# Patient Record
Sex: Male | Born: 1960 | Race: White | Hispanic: No | Marital: Married | Smoking: Never smoker
Health system: Southern US, Community
[De-identification: ages and names within clinical notes are randomized; demographics above are authoritative.]

## PROBLEM LIST (undated history)

## (undated) ENCOUNTER — Emergency Department: Payer: 59

## (undated) DIAGNOSIS — R112 Nausea with vomiting, unspecified: Secondary | ICD-10-CM

## (undated) DIAGNOSIS — K219 Gastro-esophageal reflux disease without esophagitis: Secondary | ICD-10-CM

## (undated) DIAGNOSIS — J302 Other seasonal allergic rhinitis: Secondary | ICD-10-CM

## (undated) DIAGNOSIS — M1990 Unspecified osteoarthritis, unspecified site: Secondary | ICD-10-CM

## (undated) DIAGNOSIS — Z9889 Other specified postprocedural states: Secondary | ICD-10-CM

## (undated) DIAGNOSIS — C801 Malignant (primary) neoplasm, unspecified: Secondary | ICD-10-CM

## (undated) DIAGNOSIS — R29810 Facial weakness: Secondary | ICD-10-CM

## (undated) DIAGNOSIS — R202 Paresthesia of skin: Secondary | ICD-10-CM

## (undated) HISTORY — PX: TONSILLECTOMY: SUR1361

## (undated) HISTORY — DX: Unspecified osteoarthritis, unspecified site: M19.90

## (undated) HISTORY — PX: KNEE ARTHROSCOPY: SUR90

## (undated) HISTORY — DX: Paresthesia of skin: R20.2

## (undated) HISTORY — DX: Facial weakness: R29.810

## (undated) HISTORY — DX: Other seasonal allergic rhinitis: J30.2

---

## 1992-12-28 HISTORY — PX: HERNIA REPAIR: SHX51

## 2006-12-28 DIAGNOSIS — C801 Malignant (primary) neoplasm, unspecified: Secondary | ICD-10-CM

## 2006-12-28 HISTORY — PX: THYROIDECTOMY: SHX17

## 2006-12-28 HISTORY — DX: Malignant (primary) neoplasm, unspecified: C80.1

## 2007-04-11 ENCOUNTER — Encounter: Admission: RE | Admit: 2007-04-11 | Discharge: 2007-04-11 | Payer: Self-pay | Admitting: Sports Medicine

## 2007-05-25 ENCOUNTER — Encounter (INDEPENDENT_AMBULATORY_CARE_PROVIDER_SITE_OTHER): Payer: Self-pay | Admitting: Diagnostic Radiology

## 2007-05-25 ENCOUNTER — Ambulatory Visit (HOSPITAL_COMMUNITY): Admission: RE | Admit: 2007-05-25 | Discharge: 2007-05-25 | Payer: Self-pay | Admitting: Otolaryngology

## 2007-07-20 ENCOUNTER — Encounter (INDEPENDENT_AMBULATORY_CARE_PROVIDER_SITE_OTHER): Payer: Self-pay | Admitting: Otolaryngology

## 2007-07-21 ENCOUNTER — Inpatient Hospital Stay (HOSPITAL_COMMUNITY): Admission: RE | Admit: 2007-07-21 | Discharge: 2007-07-25 | Payer: Self-pay | Admitting: Otolaryngology

## 2007-08-12 ENCOUNTER — Encounter: Admission: RE | Admit: 2007-08-12 | Discharge: 2007-08-12 | Payer: Self-pay | Admitting: Endocrinology

## 2007-08-22 ENCOUNTER — Encounter: Admission: RE | Admit: 2007-08-22 | Discharge: 2007-08-22 | Payer: Self-pay | Admitting: Endocrinology

## 2007-08-30 ENCOUNTER — Encounter: Payer: Self-pay | Admitting: Endocrinology

## 2008-03-07 ENCOUNTER — Ambulatory Visit (HOSPITAL_BASED_OUTPATIENT_CLINIC_OR_DEPARTMENT_OTHER): Admission: RE | Admit: 2008-03-07 | Discharge: 2008-03-07 | Payer: Self-pay | Admitting: Otolaryngology

## 2008-03-18 ENCOUNTER — Ambulatory Visit: Payer: Self-pay | Admitting: Internal Medicine

## 2008-07-02 ENCOUNTER — Encounter: Admission: RE | Admit: 2008-07-02 | Discharge: 2008-07-02 | Payer: Self-pay | Admitting: Endocrinology

## 2008-07-03 ENCOUNTER — Encounter: Admission: RE | Admit: 2008-07-03 | Discharge: 2008-07-03 | Payer: Self-pay | Admitting: Endocrinology

## 2008-07-04 ENCOUNTER — Encounter: Admission: RE | Admit: 2008-07-04 | Discharge: 2008-07-04 | Payer: Self-pay | Admitting: Endocrinology

## 2008-07-19 ENCOUNTER — Encounter: Admission: RE | Admit: 2008-07-19 | Discharge: 2008-07-19 | Payer: Self-pay | Admitting: Endocrinology

## 2009-07-08 ENCOUNTER — Encounter (HOSPITAL_COMMUNITY): Admission: RE | Admit: 2009-07-08 | Discharge: 2009-09-26 | Payer: Self-pay | Admitting: Endocrinology

## 2009-11-08 ENCOUNTER — Encounter: Admission: RE | Admit: 2009-11-08 | Discharge: 2009-11-08 | Payer: Self-pay | Admitting: Otolaryngology

## 2011-01-18 ENCOUNTER — Encounter: Payer: Self-pay | Admitting: Sports Medicine

## 2011-01-19 ENCOUNTER — Encounter: Payer: Self-pay | Admitting: Endocrinology

## 2011-05-12 NOTE — Procedures (Signed)
NAME:  Matthew Newton, Matthew Newton NO.:  [id number]   MEDICAL RECORD NO.:  [id number]          PATIENT TYPE:  OUT   LOCATION:  SLEEP CENTER                 FACILITY:  [facility]   PHYSICIAN:  Clinton D. Maple Hudson, MD, FCCP, FACPDATE OF BIRTH:  1959-08-04   DATE OF STUDY:  03/07/2008                            NOCTURNAL POLYSOMNOGRAM   REFERRING PHYSICIAN:  Onalee Hua L. Annalee Genta, M.D.   REFERRING PHYSICIAN:  Victorious Kundinger. Annalee Genta, M.D.   INDICATION FOR STUDY:  Hypersomnia with sleep apnea.   EPWORTH SLEEPINESS SCORE:  8/24.  BMI 29.5.  Weight 200 pounds.  Height  69 inches.  Neck 16.3 inches.   HOME MEDICATIONS:  Charted and reviewed.   SLEEP ARCHITECTURE:  Total sleep time 336 minutes with sleep efficiency  85%.  Stage 1 was 12.5%.  Stage 2 72.9%.  Stage 3 absent.  REM 14.6% of  total sleep time.  Sleep latency 10 minutes, REM latency 203 minutes.  Awake after sleep onset 47 minutes.  Arousal index 9.1.  No bedtime  medication was taken.   RESPIRATORY DATA:  Apnea hypopnea index (AHI) 3.6 events per hour which  is normal.  Twenty events were scored including 18 hypopneas and 2  obstructive apneas.  Most events were associated with supine sleep  position and REM.  REM/AHI 12.2.  There were insufficient events to  qualify for CPAP titration by split night protocol.   OXYGEN DATA:  Mild snoring with oxygen desaturation to a nadir of 84%.  Mean oxygen saturation through the study was 94.4% on room air.  A total  of 0.8 minutes were recorded with oxygen saturation less than 88%.   CARDIAC DATA:  Normal sinus rhythm.   MOVEMENT-PARASOMNIA:  No significant movement disturbance.  Bathroom x1.   IMPRESSIONS-RECOMMENDATIONS:  1. Occasional respiratory events, within normal limits.  AHI 3.6      (normal range 0-3) with most events recorded while supine or in REM      moderately offering potential for improvement if he can sleep on      his sides.  Mild snoring with oxygen desaturation to a  nadir of      84%.  2. 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  He also has a history of thyroid disease.  Both of these      might contribute to complaints of daytime sleepiness if relevant.      Clinton D. Maple Hudson, MD, [facility], FACP  Diplomate, Biomedical engineer of Sleep Medicine  Electronically Signed     CDY/MEDQ  D:  03/18/2008 09:13:39  T:  03/18/2008 09:30:59  Job:  [id number]

## 2011-05-12 NOTE — Op Note (Signed)
NAME:  Matthew Newton, Matthew Newton               ACCOUNT NO.:  [id number]   MEDICAL RECORD NO.:  [id number]          PATIENT TYPE:  OIB   LOCATION:  5740                         FACILITY:  [facility]   PHYSICIAN:  Kinnie Scales. Annalee Genta, M.D.DATE OF BIRTH:  03/03/1961   DATE OF PROCEDURE:  07/20/2007  DATE OF DISCHARGE:                               OPERATIVE REPORT   PREOPERATIVE DIAGNOSIS:  1. Metastatic thyroid cancer to the left neck.  2. Left neck mass.   POSTOPERATIVE DIAGNOSIS:  1. Metastatic thyroid cancer to the left neck.  2. Left neck mass.   SURGICAL PROCEDURES:  1. Total thyroidectomy.  2. Left modified radical neck dissection.   SURGEON:  Kinnie Scales. Annalee Genta, M.D.   ASSISTANTEnrigue Catena H. Pollyann Kennedy, M.D.   ANESTHESIA:  General endotracheal with nerve integrity monitoring  (NIMS).   ESTIMATED BLOOD LOSS:  200 mL.   COMPLICATIONS:  None.   DISPOSITION:  The patient was transferred from the operating room to the  recovery room in stable condition.   BRIEF HISTORY:  Matthew Newton is a 50 year old white male who is referred  to our office for evaluation of a left neck cyst.  The patient had  undergone MRI scan of the neck for chronic upper extremity pain and  numbness and on workup, was noted to have a cystic enhancing mass in the  left superior neck.  Further evaluation included MRI of the neck which  showed complex enhancing mass consistent with possible cyst versus  metastatic disease.  The remainder of the neck including anterior  compartment, right neck, and thyroid appeared normal.  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  Given the  patient's history and physical examination, I recommended the above  surgical procedures.  The risk, benefits, and possible complications of  each of these procedures were discussed in detail with the patient and  his wife and they understood and  concurred with our plan for surgery  which is scheduled for July 20, 2007, under general anesthesia at [facility] OR.   SURGICAL PROCEDURES:  The patient was brought to the operating room at  [facility] on July 20, 2007, was taken to the operating room  and general endotracheal anesthesia established without difficulty.  The  Xomed NIMS endotracheal tube was used throughout the thyroid portion of  the surgical procedure for intraoperative recurrent laryngeal nerve  monitoring. With the patient adequately anesthetized, he was positioned  on the operating table and prepped and draped in a sterile fashion.  He  was injected with 5 mL of 1% lidocaine with 1:100,000 epinephrine,  injected in a subcutaneous fashion along the proposed skin incision.  After allowing adequate time for vasoconstriction and hemostasis, the  procedure was begun.   A curvilinear incision was created along the anterior neck and extended  over the left lateral neck to the tip of the mastoid.  The incision was  carried through the skin and underlying deep subcutaneous tissue.  The  platysma muscle was divided  and subplatysmal flaps were elevated  superiorly and inferiorly.  The surgical procedure was begun with a left  modified radical neck dissection.  The anterior border of the  sternocleidomastoid muscle was palpated and fascia was elevated  anteriorly.  The fascia was then removed from the anterior and medial  components of the sternocleidomastoid muscle which was retracted  laterally.  Superiorly, the posterior belly of the digastric muscle was  identified and the digastric was followed from posterior to anterior.  Dissection was then carried out in the level 1 compartment of the left  neck with dissection of the submandibular gland after identification and  preservation of the left marginal mandibular nerve.  The submandibular  gland was dissected, common facial artery and vein were  divided and  suture ligated, and the submandibular gland was reflected inferiorly.  The lingual nerve contribution to the gland was divided and ligated.  Dissection was carried anteriorly and the submandibular duct was  identified and suture ligated.  The mylohyoid muscle was then elevated  anteriorly.  Dissection was then carried out in the deep aspect of zone  1 of the neck.  The superficial submental area was then dissected.  Dissection was then carried along the right anterior border of the  sternocleidomastoid muscle reflecting the fibrofatty tissue off the  strap muscles and anterior aspect of the neck completing the entire zone  6 anterior compartment neck dissection.   The patient's posterior zone 5 was then palpated.  Dissection was  carried out.  The spinal accessory nerve was identified and preserved  throughout its course. The superior aspect of zone 2 was dissected free  and the entire upper neck was then elevated from posterior to anterior.  Posterior aspect of zone 5 was then dissected inferiorly.  The omohyoid  muscle was divided and dissection carried into the inferior aspect of  zone 4.  The inferior aspect of the jugular vein was identified and  dissection was then carried out along the jugular vein, preserving the  jugular vein and carotid artery as well as vagus nerve throughout their  course.  Anterior contributing branches of the jugular vein were divided  and suture ligated.  Dissection was then carried anteriorly and the  entire neck specimen removed and marked for pathology and sent for  permanent section evaluation.   Attention was then turned to the thyroid. The strap muscles were  identified and divided in the midline.  The right thyroid lobe was  palpated and gently dissected.  The middle thyroid vein was divided and  suture ligated.  The inferior aspect of the gland was then dissected and  the inferior thyroid pedicle was identified, divided, and suture   ligated.  A parathyroid gland was identified at this location. The  recurrent laryngeal nerve was identified and was traced from proximal to  distal. The superior aspect of the right thyroid lobe was then dissected  and the vascular pedicle was divided and suture ligated.  The right  thyroid lobe was reflected anteriorly and dissected from the trachea  where there was no apparent adhesion or significant attachment.  The  left thyroid lobe was then treated in a similar fashion with gentle  dissection external to the gland dividing and suture ligating the  vascular structures and identifying the inferior parathyroid gland which  was preserved.  The recurrent laryngeal nerve on the left hand side was  identified and traced distally. The gland was then reflected off its  tracheal attachments and divided  at the ligament.  This was sent to  pathology for gross microscopic evaluation.   The anterior compartment of the neck was then inspected and palpated.  The suprasternal area was free of any lymphadenopathy or disease and the  remainder of the neck appeared normal.  The patient's wound was closed  in layers. A 7 mm flat Blake drain was placed on the right aspect of the  neck at the thyroid bed, on the left hand side, a 10 mm Blake drain was  placed on the neck dissection site, and these were sutured into position  through an external stab incision.  The wound was then closed entirely  after reapproximation of the anterior strap muscles with 2-0 chromic  suture in an interrupted fashion.  The subplatysmal layer was closed  with 3-0 Vicryl suture and deep subcutaneous layer was closed with the  same stitch.  The final skin margins were closed with surgical staples  and bacitracin was applied. Prior to closure, the recurrent laryngeal  nerves were identified and using the NIMS monitor were stimulated, they  appeared to respond to normal stimulation at 0.50 mA.   The patient's wound was then  dressed bacitracin ointment.  He was  awakened from his anesthetic and extubated, he was transferred from the  operating room to the recovery room in stable condition.  No  complications.  Blood loss approximately 200 mL.           ______________________________  Kinnie Scales. Annalee Genta, M.D.     DLS/MEDQ  D:  16/09/9603  T:  07/20/2007  Job:  [id number]

## 2011-05-15 NOTE — Discharge Summary (Signed)
NAME:  Matthew Newton, MATHES NO.:  [id number]   MEDICAL RECORD NO.:  [id number]          PATIENT TYPE:  INP   LOCATION:  5740                         FACILITY:  [facility]   PHYSICIAN:  Kinnie Scales. Annalee Genta, M.D.DATE OF BIRTH:  11-Apr-1961   DATE OF ADMISSION:  07/20/2007  DATE OF DISCHARGE:  07/25/2007                               DISCHARGE SUMMARY   PREOPERATIVE/POSTOPERATIVE DIAGNOSIS AND INDICATION FOR SURGERY:  1. 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  2. Postoperative hypocalcemia.   PROCEDURE THIS ADMISSION:  Total thyroidectomy and left modified radical  neck dissection performed on July 20, 2007.  The patient is discharged  to home in stable condition in the company of his family.   DISCHARGE MEDICATIONS:  Include:  1. Percocet 5/325 one-two p.o. every 4-6 hours, dispense 30 without      refills.  2. Augmentin 500 mg p.o. b.i.d. for 10 days.  3. Calcium supplementation including calcium carbonate 1000 mg t.i.d.  4. Calcitriol 1 tablet t.i.d.   The patient is to limit physical activity.  No lifting, straining, or  exercise.  Wound care consists of half-strength hydroperoxide and  antibiotic ointment on a twice-daily basis.  The patient will resume low-  iodine diet as tolerated and will follow up with Dr. Annalee Genta 1 week  postoperatively for evaluation and staple removal and follow up with Dr.  Dorisann Frames for management of postoperative hypocalcemia and long-  term management of hypothyroidism and thyroid cancer.   BRIEF HISTORY:  Matthew Newton is a 50 year old white male who was referred  for evaluation of a cystic left superior neck mass.  The patient had  undergone an MRI scan for neck pain.  He was found to have mild  degenerative joint changes and some chronic disk disease.  An incidental  finding noted a cystic mass in the left superolateral neck.  Evaluation  of this area, including physical examination and  fine-needle aspiration  with ultrasound guidance showed findings consistent with metastatic  papillary carcinoma of the thyroid.  Re-evaluation of the thyroid on  imaging showed no evidence of abnormality or nodular mass.  There were  no other areas of concern with the exception of cystic adenopathy in the  left neck.  Given the patient's history and physical examination,  recommended undertaking left neck dissection for removal of cystic mass  and then proceeding with a total thyroidectomy based on intraoperative  pathologic findings.  Risks, benefits, possible complications of these  procedures were discussed in detail with the patient and his wife, and  they understood and concurred with our plan for surgery, which is  scheduled for general anesthesia at [facility] on July 20, 2007, with intraoperative nerve monitoring.   HOSPITAL COURSE:  The patient is admitted to the ENT service under Dr.  Thurmon Fair care on July 20, 2007.  He underwent a left modified radical  neck dissection and total thyroidectomy, under general anesthesia.  He  was transferred from the operating room to recovery and from recovery to  unit 5700 for postoperative  care.  The patient had anticipated  postoperative course regarding his neck surgery with gradual decrease in  drainage output from the surgically placed drain.  On the third  postoperative day, drains were removed, as the output had fallen to an  appropriate level, and the patient continued intraoperative monitoring  for hypocalcemia and fell from a normal calcium level to a calcium nadir  of 7.5 on July 24, 2007.  The endocrine service under Dr. Willeen Cass care  was consulted for evaluation and management of postoperative  hypocalcemia.  The patient had been started on oral calcium  supplementation at the time of his surgical procedure, and calcitriol  was added, 1.0 mcg b.i.d.  It was increased to t.i.d. in order to  control hypocalcemia, and  his levels were closely monitored.  By the  time of discharge, on July 25, 2007, the patient's calcium level had  risen to 8.5, and he was clinically stable.  The patient had normal oral  intake with soft diet and a normal bowel and bladder function with mild  constipation, which was managed with Dulcolax suppository.  He had  normal ambulation, and incision was intact and healing well at the time  of his discharge on July 25, 2007.  The patient was discharged home in  stable condition in the company of his family, who understand and concur  with our discharge plan, which is outlined as above.  He will follow up  within 1 week for postoperative care or sooner as warranted by any  further problems regarding hypocalcemia.           ______________________________  Kinnie Scales. Annalee Genta, M.D.     DLS/MEDQ  D:  16/09/9603  T:  08/24/2007  Job:  [id number]   cc:   Dorisann Frames, M.D.

## 2011-10-12 LAB — CBC
HCT: 42.8
MCV: 87
Platelets: 294
RBC: 4.92
WBC: 6.6

## 2011-10-12 LAB — ALBUMIN: Albumin: 3.4 — ABNORMAL LOW

## 2011-10-12 LAB — BASIC METABOLIC PANEL
Chloride: 105
Creatinine, Ser: 1.14
GFR calc Af Amer: >60
GFR calc non Af Amer: >60
Potassium: 4.7

## 2011-10-12 LAB — CALCIUM
Calcium: 7.5 — ABNORMAL LOW
Calcium: 7.7 — ABNORMAL LOW
Calcium: 7.9 — ABNORMAL LOW
Calcium: 8.1 — ABNORMAL LOW
Calcium: 8.3 — ABNORMAL LOW
Calcium: 8.5
Calcium: 8.6

## 2011-12-15 ENCOUNTER — Other Ambulatory Visit: Payer: Self-pay | Admitting: Orthopedic Surgery

## 2012-01-04 ENCOUNTER — Encounter (HOSPITAL_BASED_OUTPATIENT_CLINIC_OR_DEPARTMENT_OTHER): Payer: Self-pay | Admitting: *Deleted

## 2012-01-07 ENCOUNTER — Ambulatory Visit (HOSPITAL_BASED_OUTPATIENT_CLINIC_OR_DEPARTMENT_OTHER)
Admission: RE | Admit: 2012-01-07 | Discharge: 2012-01-07 | Disposition: A | Discharge status: Home / Self Care | Payer: 59 | Source: Ambulatory Visit | Attending: Orthopedic Surgery | Admitting: Orthopedic Surgery

## 2012-01-07 ENCOUNTER — Encounter (HOSPITAL_BASED_OUTPATIENT_CLINIC_OR_DEPARTMENT_OTHER): Payer: Self-pay | Admitting: Anesthesiology

## 2012-01-07 ENCOUNTER — Encounter (HOSPITAL_BASED_OUTPATIENT_CLINIC_OR_DEPARTMENT_OTHER)
Admission: RE | Disposition: A | Discharge status: Home / Self Care | Payer: Self-pay | Source: Ambulatory Visit | Attending: Orthopedic Surgery

## 2012-01-07 ENCOUNTER — Encounter (HOSPITAL_BASED_OUTPATIENT_CLINIC_OR_DEPARTMENT_OTHER): Payer: Self-pay | Admitting: *Deleted

## 2012-01-07 ENCOUNTER — Ambulatory Visit (HOSPITAL_BASED_OUTPATIENT_CLINIC_OR_DEPARTMENT_OTHER): Payer: 59 | Admitting: Anesthesiology

## 2012-01-07 DIAGNOSIS — IMO0002 Reserved for concepts with insufficient information to code with codable children: Secondary | ICD-10-CM | POA: Insufficient documentation

## 2012-01-07 DIAGNOSIS — K219 Gastro-esophageal reflux disease without esophagitis: Secondary | ICD-10-CM | POA: Insufficient documentation

## 2012-01-07 DIAGNOSIS — E039 Hypothyroidism, unspecified: Secondary | ICD-10-CM | POA: Insufficient documentation

## 2012-01-07 DIAGNOSIS — M24139 Other articular cartilage disorders, unspecified wrist: Secondary | ICD-10-CM | POA: Insufficient documentation

## 2012-01-07 HISTORY — PX: WRIST ARTHROSCOPY: SHX838

## 2012-01-07 HISTORY — DX: Nausea with vomiting, unspecified: R11.2

## 2012-01-07 HISTORY — DX: Malignant (primary) neoplasm, unspecified: C80.1

## 2012-01-07 HISTORY — DX: Gastro-esophageal reflux disease without esophagitis: K21.9

## 2012-01-07 HISTORY — DX: Other specified postprocedural states: Z98.890

## 2012-01-07 LAB — POCT HEMOGLOBIN-HEMACUE: Hemoglobin: 14.1 g/dL (ref 13.0–17.0)

## 2012-01-07 SURGERY — ARTHROSCOPY, WRIST
Anesthesia: General | Site: Wrist | Laterality: Left | Wound class: Clean

## 2012-01-07 MED ORDER — LACTATED RINGERS IV SOLN
INTRAVENOUS | Status: DC
  Administered 2012-01-07 (×3): via INTRAVENOUS

## 2012-01-07 MED ORDER — FENTANYL CITRATE 0.05 MG/ML IJ SOLN
50.0000 ug | INTRAMUSCULAR | Status: DC | PRN
  Administered 2012-01-07: 100 ug via INTRAVENOUS

## 2012-01-07 MED ORDER — HYDROMORPHONE HCL 2 MG PO TABS
ORAL_TABLET | ORAL | Status: AC
Start: 2012-01-07 — End: 2012-01-17

## 2012-01-07 MED ORDER — PROPOFOL 10 MG/ML IV EMUL
INTRAVENOUS | Status: DC | PRN
  Administered 2012-01-07: 200 mg via INTRAVENOUS

## 2012-01-07 MED ORDER — FENTANYL CITRATE 0.05 MG/ML IJ SOLN: 25.0000 ug | INTRAMUSCULAR | Status: DC | PRN

## 2012-01-07 MED ORDER — CHLORHEXIDINE GLUCONATE 4 % EX LIQD: 60.0000 mL | Freq: Once | CUTANEOUS | Status: DC

## 2012-01-07 MED ORDER — DEXAMETHASONE SODIUM PHOSPHATE 4 MG/ML IJ SOLN
INTRAMUSCULAR | Status: DC | PRN
  Administered 2012-01-07: 10 mg via INTRAVENOUS

## 2012-01-07 MED ORDER — CEPHALEXIN 500 MG PO CAPS: 500.0000 mg | ORAL_CAPSULE | Freq: Three times a day (TID) | ORAL | Status: AC

## 2012-01-07 MED ORDER — CEFAZOLIN SODIUM 1-5 GM-% IV SOLN
1.0000 g | Freq: Once | INTRAVENOUS | Status: AC
Start: 2012-01-07 — End: 2012-01-07
  Administered 2012-01-07: 2 g via INTRAVENOUS

## 2012-01-07 MED ORDER — MORPHINE SULFATE 2 MG/ML IJ SOLN: 0.0500 mg/kg | INTRAMUSCULAR | Status: DC | PRN

## 2012-01-07 MED ORDER — IBUPROFEN 600 MG PO TABS: 600.0000 mg | ORAL_TABLET | Freq: Four times a day (QID) | ORAL | Status: AC | PRN

## 2012-01-07 MED ORDER — ONDANSETRON HCL 4 MG/2ML IJ SOLN
INTRAMUSCULAR | Status: DC | PRN
  Administered 2012-01-07: 4 mg via INTRAVENOUS

## 2012-01-07 MED ORDER — LIDOCAINE HCL 1 % IJ SOLN
INTRAMUSCULAR | Status: DC | PRN
  Administered 2012-01-07: 2 mL via INTRADERMAL

## 2012-01-07 MED ORDER — METOCLOPRAMIDE HCL 5 MG/ML IJ SOLN: 10.0000 mg | Freq: Once | INTRAMUSCULAR | Status: DC | PRN

## 2012-01-07 MED ORDER — ROPIVACAINE HCL 5 MG/ML IJ SOLN
INTRAMUSCULAR | Status: DC | PRN
  Administered 2012-01-07: 30 mL via EPIDURAL

## 2012-01-07 MED ORDER — MIDAZOLAM HCL 2 MG/2ML IJ SOLN
0.5000 mg | INTRAMUSCULAR | Status: DC | PRN
  Administered 2012-01-07: 2 mg via INTRAVENOUS

## 2012-01-07 SURGICAL SUPPLY — 101 items
BANDAGE ADHESIVE 1X3 (GAUZE/BANDAGES/DRESSINGS) IMPLANT
BANDAGE ELASTIC 3 VELCRO ST LF (GAUZE/BANDAGES/DRESSINGS) ×3 IMPLANT
BANDAGE ELASTIC 4 VELCRO ST LF (GAUZE/BANDAGES/DRESSINGS) ×2 IMPLANT
BANDAGE GAUZE ELAST BULKY 4 IN (GAUZE/BANDAGES/DRESSINGS) ×2 IMPLANT
BIT DRILL 1.8 (BIT) ×1
BIT DRILL 1.8MM (BIT) IMPLANT
BIT DRILL 2.4 (BIT) ×1
BIT DRILL QC 2.4 MINI 80 (BIT) IMPLANT
BLADE AVERAGE 25X9 (BLADE) ×1 IMPLANT
BLADE MINI RND TIP GREEN BEAV (BLADE) ×1 IMPLANT
BLADE SURG 15 STRL LF DISP TIS (BLADE) ×1 IMPLANT
BLADE SURG 15 STRL SS (BLADE) ×4
BNDG CMPR 9X4 STRL LF SNTH (GAUZE/BANDAGES/DRESSINGS) ×1
BNDG ESMARK 4X9 LF (GAUZE/BANDAGES/DRESSINGS) ×2 IMPLANT
BRUSH SCRUB EZ PLAIN DRY (MISCELLANEOUS) ×2 IMPLANT
BUR CUDA 2.9 (BURR) IMPLANT
BUR FULL RADIUS 2.9 (BURR) IMPLANT
BUR GATOR 2.9 (BURR) ×1 IMPLANT
BUR SPHERICAL 2.9 (BURR) IMPLANT
CANISTER SUCTION 1200CC (MISCELLANEOUS) IMPLANT
CLOTH BEACON ORANGE TIMEOUT ST (SAFETY) ×2 IMPLANT
CORDS BIPOLAR (ELECTRODE) ×1 IMPLANT
COVER MAYO STAND STRL (DRAPES) ×2 IMPLANT
COVER TABLE BACK 60X90 (DRAPES) ×2 IMPLANT
CUFF TOURNIQUET SINGLE 18IN (TOURNIQUET CUFF) ×1 IMPLANT
DECANTER SPIKE VIAL GLASS SM (MISCELLANEOUS) IMPLANT
DRAPE EXTREMITY T 121X128X90 (DRAPE) ×2 IMPLANT
DRAPE OEC MINIVIEW 54X84 (DRAPES) ×1 IMPLANT
DRAPE SURG 17X23 STRL (DRAPES) ×2 IMPLANT
DRILL BIT 1.8MM (BIT) ×2
DRILL BIT 2.4MM (BIT) ×2
DRSG EMULSION OIL 3X3 NADH (GAUZE/BANDAGES/DRESSINGS) IMPLANT
GAUZE KERLIX 2  STERILE LF (GAUZE/BANDAGES/DRESSINGS) ×1 IMPLANT
GAUZE XEROFORM 1X8 LF (GAUZE/BANDAGES/DRESSINGS) IMPLANT
GLOVE BIO SURGEON STRL SZ 6.5 (GLOVE) ×2 IMPLANT
GLOVE BIOGEL M STRL SZ7.5 (GLOVE) ×2 IMPLANT
GLOVE BIOGEL PI IND STRL 7.0 (GLOVE) IMPLANT
GLOVE BIOGEL PI INDICATOR 7.0 (GLOVE) ×1
GLOVE ORTHO TXT STRL SZ7.5 (GLOVE) ×2 IMPLANT
GOWN PREVENTION PLUS XLARGE (GOWN DISPOSABLE) ×2 IMPLANT
GOWN PREVENTION PLUS XXLARGE (GOWN DISPOSABLE) ×4 IMPLANT
IV NS 500ML (IV SOLUTION) ×2
IV NS 500ML BAXH (IV SOLUTION) ×1 IMPLANT
IV NS IRRIG 3000ML ARTHROMATIC (IV SOLUTION) ×1 IMPLANT
LOOP VESSEL MAXI BLUE (MISCELLANEOUS) IMPLANT
NDL EPIDURAL TUOHY 20GX3.5 (NEEDLE) IMPLANT
NDL MAYO 6 CRC TAPER PT (NEEDLE) IMPLANT
NDL SAFETY ECLIPSE 18X1.5 (NEEDLE) IMPLANT
NEEDLE 27GAX1X1/2 (NEEDLE) IMPLANT
NEEDLE HYPO 18GX1.5 SHARP (NEEDLE) ×2
NEEDLE HYPO 22GX1.5 SAFETY (NEEDLE) ×2 IMPLANT
NEEDLE MAYO 6 CRC TAPER PT (NEEDLE) IMPLANT
NEEDLE TUOHY 20GX3.5 (NEEDLE) IMPLANT
NS IRRIG 1000ML POUR BTL (IV SOLUTION) ×1 IMPLANT
PACK BASIN DAY SURGERY FS (CUSTOM PROCEDURE TRAY) ×2 IMPLANT
PAD CAST 3X4 CTTN HI CHSV (CAST SUPPLIES) ×1 IMPLANT
PADDING CAST ABS 4INX4YD NS (CAST SUPPLIES) ×1
PADDING CAST ABS COTTON 4X4 ST (CAST SUPPLIES) ×1 IMPLANT
PADDING CAST COTTON 3X4 STRL (CAST SUPPLIES) ×4
PASSER SUT SWANSON 36MM LOOP (INSTRUMENTS) IMPLANT
PLATE LCP 6H 52MM 2.4MM (Plate) ×1 IMPLANT
SCREW CORTEX 2.4X14 (Screw) ×2 IMPLANT
SCREW CORTEX 2.4X16MM (Screw) ×2 IMPLANT
SCREW CORTEX 2.4X18 (Screw) ×1 IMPLANT
SCREW LOCKING 2.4X14MM (Screw) ×1 IMPLANT
SET SM JOINT TUBING/CANN (CANNULA) ×2 IMPLANT
SLEEVE SCD COMPRESS KNEE MED (MISCELLANEOUS) ×1 IMPLANT
SPLINT PLASTER CAST XFAST 3X15 (CAST SUPPLIES) ×1 IMPLANT
SPLINT PLASTER EXTRA FAST 3X15 (CAST SUPPLIES) ×16
SPLINT PLASTER GYPS XFAST 3X15 (CAST SUPPLIES) IMPLANT
SPLINT PLASTER XTRA FASTSET 3X (CAST SUPPLIES)
SPONGE GAUZE 4X4 12PLY (GAUZE/BANDAGES/DRESSINGS) ×2 IMPLANT
STOCKINETTE 4X48 STRL (DRAPES) ×2 IMPLANT
STRIP CLOSURE SKIN 1/2X4 (GAUZE/BANDAGES/DRESSINGS) ×1 IMPLANT
SUCTION FRAZIER TIP 10 FR DISP (SUCTIONS) IMPLANT
SUT ETHIBOND 3-0 V-5 (SUTURE) ×1 IMPLANT
SUT ETHILON 5 0 P 3 18 (SUTURE)
SUT FIBERWIRE 2-0 18 17.9 3/8 (SUTURE) ×2
SUT FIBERWIRE 3-0 18 TAPR NDL (SUTURE) ×2
SUT MERSILENE 4 0 P 3 (SUTURE) IMPLANT
SUT NYLON ETHILON 5-0 P-3 1X18 (SUTURE) IMPLANT
SUT PROLENE 3 0 PS 2 (SUTURE) ×1 IMPLANT
SUT STEEL 0 (SUTURE) ×2
SUT STEEL 0 18XMFL TIE 17 (SUTURE) IMPLANT
SUT STEEL 3 0 (SUTURE) IMPLANT
SUT VIC AB 0 CT1 27 (SUTURE)
SUT VIC AB 0 CT1 27XBRD ANBCTR (SUTURE) IMPLANT
SUT VIC AB 2-0 SH 27 (SUTURE)
SUT VIC AB 2-0 SH 27XBRD (SUTURE) IMPLANT
SUT VIC AB 4-0 P-3 18XBRD (SUTURE) IMPLANT
SUT VIC AB 4-0 P3 18 (SUTURE) ×4
SUT VICRYL 4-0 PS2 18IN ABS (SUTURE) IMPLANT
SUTURE FIBERWR 2-0 18 17.9 3/8 (SUTURE) IMPLANT
SUTURE FIBERWR 3-0 18 TAPR NDL (SUTURE) IMPLANT
SYR 3ML 23GX1 SAFETY (SYRINGE) IMPLANT
SYR BULB 3OZ (MISCELLANEOUS) ×1 IMPLANT
SYR CONTROL 10ML LL (SYRINGE) ×2 IMPLANT
TRAY DSU PREP LF (CUSTOM PROCEDURE TRAY) ×2 IMPLANT
TUBE CONNECTING 20X1/4 (TUBING) ×2 IMPLANT
UNDERPAD 30X30 INCONTINENT (UNDERPADS AND DIAPERS) ×2 IMPLANT
WATER STERILE IRR 1000ML POUR (IV SOLUTION) ×2 IMPLANT

## 2012-01-07 NOTE — Op Note (Signed)
Op note dictated: [id number] 01/07/12

## 2012-01-07 NOTE — Anesthesia Preprocedure Evaluation (Signed)
Anesthesia Evaluation  Patient identified by MRN, date of birth, ID band Patient awake    Reviewed: Allergy & Precautions, H&P , NPO status , Patient's Chart, lab work & pertinent test results, reviewed documented beta blocker date and time   History of Anesthesia Complications (+) PONV  Airway Mallampati: II TM Distance: >3 FB Neck ROM: full    Dental   Pulmonary neg pulmonary ROS,          Cardiovascular neg cardio ROS     Neuro/Psych Negative Neurological ROS  Negative Psych ROS   GI/Hepatic negative GI ROS, Neg liver ROS, GERD-  Medicated and Controlled,  Endo/Other  Negative Endocrine ROSHypothyroidism   Renal/GU negative Renal ROS  Genitourinary negative   Musculoskeletal   Abdominal   Peds  Hematology negative hematology ROS (+)   Anesthesia Other Findings See surgeon's H&P   Reproductive/Obstetrics negative OB ROS                           Anesthesia Physical Anesthesia Plan  ASA: II  Anesthesia Plan: General   Post-op Pain Management: MAC Combined w/ Regional for Post-op pain   Induction:   Airway Management Planned: LMA  Additional Equipment:   Intra-op Plan:   Post-operative Plan: Extubation in OR  Informed Consent: I have reviewed the patients History and Physical, chart, labs and discussed the procedure including the risks, benefits and alternatives for the proposed anesthesia with the patient or authorized representative who has indicated his/her understanding and acceptance.     Plan Discussed with: CRNA and Surgeon  Anesthesia Plan Comments:         Anesthesia Quick Evaluation

## 2012-01-07 NOTE — H&P (Signed)
Matthew Newton is an 51 y.o. male.   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 This occurred third 2012. At that time he tried to set her up for a large fish. This radially deviated his left wrist. He weight for approximately 3 months to see if his symptoms with a plate. He still had pain with radial deviation and certain load bearing strains on the left wrist. He presented to our office in July of 2012 for further evaluation and treatment. He underwent an MRI of the left wrist in December of 2012. This revealed a marked ulnar plus variance. TheTFC was found to be intact. He had a partial tear of the scapholunate ligament in addition to moderate carpal joint effusion and pisotriquetral arthrosis. There was also a non-united ulnar styloid fracture. His findings were discussed with the patient at length and he was advised to undergo diagnostic arthroscopy with excision of trapeziform and possible ulnar shortening.  Past Medical History  Diagnosis Date  . Cancer 2008    thyroid  . GERD (gastroesophageal reflux disease)   . PONV (postoperative nausea and vomiting)     Past Surgical History  Procedure Date  . Tonsillectomy   . Thyroidectomy 2008  . Knee arthroscopy 1990 & 1991  . Hernia repair 1994    Right    History reviewed. No pertinent family history. Social History:  reports that he has never smoked. He has never used smokeless tobacco. He reports that he does not drink alcohol or use illicit drugs.  Allergies:  Allergies  Allergen Reactions  . Contrast Media (Iodinated Diagnostic Agents) Shortness Of Breath    Pt states it "closed his chest up" and he "couldn't breathe".    Medications Prior to Admission  Medication Dose Route Frequency Provider Last Rate Last Dose  . ceFAZolin (ANCEF) IVPB 1 g/50 mL premix  1  g Intravenous Once Wyn Forster., MD      . chlorhexidine (HIBICLENS) 4 % liquid 4 application  60 mL Topical Once       . lactated ringers infusion   Intravenous Continuous Constance Goltz, MD 10 mL/hr at 01/07/12 1029     Medications Prior to Admission  Medication Sig Dispense Refill  . cetirizine (ZYRTEC) 10 MG tablet Take 10 mg by mouth daily.        Marland Kitchen dexlansoprazole (DEXILANT) 60 MG capsule Take 60 mg by mouth daily.        Marland Kitchen levothyroxine (SYNTHROID, LEVOTHROID) 112 MCG tablet Take 112 mcg by mouth daily.        . multivitamin-iron-minerals-folic acid (CENTRUM) chewable tablet Chew 1 tablet by mouth daily.          No results found for this or any previous visit (from the past 48 hour(s)).  No results found.   Pertinent items are noted in HPI.  Blood pressure 123/83, pulse 69, temperature 97.5 F (36.4 C), temperature source Oral, resp. rate 16, height 5\' 9"  (1.753 m), weight 83.915 kg (185 lb), SpO2 100.00%.  General appearance: alert Head: Normocephalic, without obvious abnormality Neck: supple, symmetrical, trachea midline Resp: clear to auscultation bilaterally Cardio: regular rate and rhythm, S1, S2 normal, no murmur, click, rub or gallop GI: normal findings: bowel sounds normal Extremities: Examination of his left wrist revealed full range of motion of the wrist and fingers. He is tender on palpation  of the nonunion site of the ulnar styloid base. He does not have traditional ulnar carpal abutment signs. His pisotriquetral grind test is positive Watson's maneuver is nontender. He has no pain with resisted pronation or supination. 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 Plan patient to be taken to the operating room to undergo left wrist arthroscopy  with debridement, excision of his pisiform and possible ulnar shortening as well as ulnar styloid non union repair.The procedure risks benefits and postoperative course were discussed with the patient at length and he was in agreement with this plan.  DASNOIT,Zailen Albarran J 01/07/2012, 11:17 AM   H&P documentation: 01/07/2012  -History and Physical Reviewed  -Patient has been re-examined  -No change in the plan of care  Wyn Forster, MD

## 2012-01-07 NOTE — Anesthesia Postprocedure Evaluation (Signed)
Anesthesia Post Note  Patient: Matthew Newton  Procedure(s) Performed:  ARTHROSCOPY WRIST - left wrist arthroscopy with debridement triangular fibrocartlidge complex repair ulna shortening and ulna nonunion repair.  Anesthesia type: General  Patient location: PACU  Post pain: Pain level controlled  Post assessment: Patient's Cardiovascular Status Stable  Last Vitals:  Filed Vitals:   01/07/12 1628  BP: 119/75  Pulse: 69  Temp: 36.5 C  Resp: 18    Post vital signs: Reviewed and stable  Level of consciousness: alert  Complications: No apparent anesthesia complications

## 2012-01-07 NOTE — Transfer of Care (Signed)
Immediate Anesthesia Transfer of Care Note  Patient: Matthew Newton  Procedure(s) Performed:  ARTHROSCOPY WRIST - left wrist arthroscopy with debridement triangular fibrocartlidge complex repair ulna shortening and ulna nonunion repair.  Patient Location: PACU  Anesthesia Type: GA combined with regional for post-op pain  Level of Consciousness: awake  Airway & Oxygen Therapy: Patient Spontanous Breathing and Patient connected to face mask oxygen  Post-op Assessment: Report given to PACU RN and Post -op Vital signs reviewed and stable  Post vital signs: Reviewed and stable Filed Vitals:   01/07/12 1007  BP: 123/83  Pulse: 69  Temp: 36.4 C  Resp: 16    Complications: No apparent anesthesia complications

## 2012-01-07 NOTE — Brief Op Note (Signed)
01/07/2012  3:17 PM  PATIENT:  Matthew Newton  51 y.o. 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  PROCEDURE:  Procedure(s): ARTHROSCOPY WRIST DOCUMENTATION  OF UNSTABLE SUPERFICIAL TFCC, COMPLETE LUNATO TRIQUETRAL LIGAMENT TEAR,  ULNAR SHORTENING, REPAIR OF ULNAR STYLOID NON UNION  SURGEON:  Surgeon(s): Wyn Forster., MD  PHYSICIAN ASSISTANT:   ASSISTANTS: Mallory Shirk.A-C    ANESTHESIA:   general  EBL:  Total I/O In: 1000 [I.V.:1000] Out: -   BLOOD ADMINISTERED:none  DRAINS: none   LOCAL MEDICATIONS USED:  Plexus block  SPECIMEN:  No Specimen  DISPOSITION OF SPECIMEN:  N/A  COUNTS:  YES  TOURNIQUET:   Total Tourniquet Time Documented: Upper Arm (Left) - 108 minutes  DICTATION: .Other Dictation: Dictation Number [id number]  PLAN OF CARE: Discharge to home after PACU  PATIENT DISPOSITION:  PACU - hemodynamically stable.   Delay start of Pharmacological VTE agent (>24hrs) due to surgical blood loss or risk of bleeding:  NOT APPLICABLE:

## 2012-01-07 NOTE — Anesthesia Procedure Notes (Addendum)
Anesthesia Regional Block:  Supraclavicular block  Pre-Anesthetic Checklist: ,, timeout performed, Correct Patient, Correct Site, Correct Laterality, Correct Procedure, Correct Position, site marked, Risks and benefits discussed,  Surgical consent,  Pre-op evaluation,  At surgeon's request and post-op pain management  Laterality: Left  Prep: chloraprep       Needles:   Needle Type: Other   (Arrow Echogenic)   Needle Length: 9cm  Needle Gauge: 21    Additional Needles:  Procedures: ultrasound guided Supraclavicular block Narrative:  Start time: 01/07/2012 11:53 AM End time: 01/07/2012 12:00 PM Injection made incrementally with aspirations every 5 mL.  Performed by: Personally  Anesthesiologist: C Frederick  Additional Notes: Ultrasound guidance used to: id relevant anatomy, confirm needle position, local anesthetic spread, avoidance of vascular puncture. Picture saved. No complications. Block performed personally by Janetta Hora. Gelene Mink, MD    Supraclavicular block Procedure Name: LMA Insertion Date/Time: 01/07/2012 12:51 PM Performed by: Jearld Shines Pre-anesthesia Checklist: Patient identified, Emergency Drugs available, Suction available, Patient being monitored and Timeout performed Patient Re-evaluated:Patient Re-evaluated prior to inductionOxygen Delivery Method: Circle System Utilized Preoxygenation: Pre-oxygenation with 100% oxygen Intubation Type: IV induction Ventilation: Mask ventilation without difficulty LMA: LMA inserted LMA Size: 4.0 Number of attempts: 1 Airway Equipment and Method: bite block Placement Confirmation: positive ETCO2 Tube secured with: Tape Dental Injury: Teeth and Oropharynx as per pre-operative assessment

## 2012-01-07 NOTE — Progress Notes (Signed)
Assisted Dr. Frederick with left, ultrasound guided, supraclavicular block. Side rails up, monitors on throughout procedure. See vital signs in flow sheet. Tolerated Procedure well. 

## 2012-01-08 ENCOUNTER — Encounter (HOSPITAL_BASED_OUTPATIENT_CLINIC_OR_DEPARTMENT_OTHER): Payer: Self-pay | Admitting: Orthopedic Surgery

## 2012-01-08 NOTE — Op Note (Signed)
NAME:  Matthew Newton, Matthew Newton NO.:  [id number]  MEDICAL RECORD NO.:  [id number]  LOCATION:                                 FACILITY:  PHYSICIAN:  Katy Fitch. Duaa Stelzner, M.D.      DATE OF BIRTH:  DATE OF PROCEDURE:  01/07/2012 DATE OF DISCHARGE:                              OPERATIVE REPORT   PREOPERATIVE DIAGNOSES:  Chronic ulnar-sided wrist pain, right wrist with load-bearing discomfort consistent with ulnocarpal abutment, probable lunotriquetral ligament tear, and possible scapholunate ligament tear.  Preoperative MRI documented evidence of chronic ulnar plus variance due to prior trauma as a young man with a nonunion of the ulnar styloid, a superficial tear of the triangular fibrocartilage with the foveal attachment intact and partial scapholunate interosseous ligament tear.  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  OPERATIONS: 1. Diagnostic arthroscopy, left wrist followed by limited synovectomy     and arthroscopic examination of triangular fibrocartilage,     scapholunate ligament, and lunotriquetral interosseous ligament. 2. A 4 mm ulnar shortening with application of a 2.4 mm ASIF hybrid     plate with lag screw fixation across the osteoplasty. 3. Reconstruction of ulnar styloid nonunion with resection of fibrous     nonunion and tension band wire fixation. 4. Reconstruction of triangular fibrocartilage with 3-0 FiberWire     grasping suture secured to ulnar styloid.  The pisotriquetral joint     was ultimately left intact.  OPERATING SURGEON:  Katy Fitch. Anant Agard, MD  ASSISTANT:  Marveen Reeks Dasnoit, PA-C  ANESTHESIA:  General by LMA supplemented by a left plexus  block.  SUPERVISING ANESTHESIOLOGIST:  Janetta Hora. Gelene Mink, M.D.  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  As a young man, he sustained a significant injury to his wrist in which he probably injured the scapholunate ligament, sustained a fracture of the ulnar styloid and had some type of growth interruption leading to a marked overgrowth of the ulna.  He subsequently presented at age 31 for evaluation at our office and was noted to have wrist pain.  We initially diagnosed a ulnar styloid nonunion and had suspicions that he had a triangular fibrocartilage tear.  We subsequently proceeded with plain films.  This documented his ulnar styloid nonunion and a marked ulnar positive variance.  We ultimately sent him for an MRI of his wrist which documented the aforementioned superficial tear of the triangular fibrocartilage, a stretched scapholunate ligament.  The MRI was not clearly diagnostic for an LT ligament tear and there was some abnormal hyaline articular cartilage signal on the triquetrum and lunate noted consistent with chronic ulnocarpal abutment.  We advised Matthew Newton to strongly consider proceeding with diagnostic arthroscopy, so that we could refine our understanding of his abutment predicament.  If he showed signs of hyaline cartilage loss or an LT ligament tear, our plan was to  shorten the ulna.  We planned on repairing his triangular fibrocartilage and repairing the ulnar nonunion.  The MRI did document significant pisotriquetral arthrosis.  We contemplated possibly fusiform resection.  Questions were invited and answered in detail in the office as well in the holding area prior to surgery.  PROCEDURE:  Silvio Sausedo was interviewed by Dr. Gelene Mink of Anesthesia and after detailed informed consent, selected general anesthesia by LMA technique,  supplemented by a left plexus block.  Dr. Justin Mend placed a plexus block with ultrasound control in the holding area leading to excellent anesthesia of the left upper extremity.  Matthew Newton was transferred to room 6 of the [facility], placed in supine position on the operating table where under Dr. Thornton Dales strict supervision, general anesthesia by LMA technique was induced.  Ancef 2 g was administered as an IV prophylactic antibiotic per weight protocol followed by routine Betadine scrub and paint of left upper extremity.  A pneumatic tourniquet was applied to proximal left brachium.  Following exsanguination of left arm with Esmarch bandage, arterial tourniquet was inflated to 220 mmHg.  Finger traps were placed on the index and long fingers and the wrist distracted the tower designed for wrist arthroscopy.  Following routine surgical time-out, the wrist was sounded with an 18-gauge needle and distended with sterile saline.  The arthroscope was introduced with standard 3-4 dorsal portal along with diagnostic arthroscopy of the radial carpus.  There was noted to be a grade 3/6 stretch type patulous injury to the scapholunate interosseous ligament.  The LT ligament was visualized and was noted to be hanging within the joint with a tear off of the triquetrum and the remaining ligament intact on the lunate.  The radial attachment of triangular fibrocartilage was intact as was the volar and dorsal limbs of the distal radial ulnar ligaments.  A 6R port was created and a nerve hook was used to palpate the triangular fibrocartilage.  The superficial fibers did not have normal tension.  The ulnar lunate ligaments were normal.  The ulnar triquetral ligament was partially torn.  The piece of triquetral joint was not visualized.  There was synovitis covering the superficial aspect of the triangular fibrocartilage.  Therefore suction shaver was used to enhance visualization.  We then  proceeded to debride the fragmented portions of the superficial triangular fibrocartilage.  The scope was then removed from 3/4 portal and placed in the 6R portal. Direct inspection of the LT ligament revealed a complete tear of the triquetral side and we noted a very large area of full-thickness hyaline cartilage loss on the triquetrum.  At this point, we decided to proceed with ulnar shortening osteoplasty. From preoperative measurements, 4 mm appeared to be appropriate degree of shortening.  We subsequently created a 10 cm incision paralleling the distal ulna to the level of the diaphysis.  The ulnar fascia was incised and bleeding points were electrocauterized by forceps.  The dorsal ulnar sensory branch was identified and gently retracted.  The 6th dorsal compartment was entered and the sub sheath of the extensor carpi ulnaris was identified and protected.  The ulna was exposed from the level of the ulnar styloid nonunion proximally to the origin of the extensor indicis proprius.  We selected a 6 hole ASIF 2.4 mm locking plate and applied 2 screws proximally.  We created a oblique 45 degree ulnar resection with an oscillating saw followed by careful application of plate with lag screw fixation across the osteoplasty and 2 distal gliding screws  followed by final locking screw.  The distal hole the plate was used to place two 26-gauge wires for tension band reconstruction of the ulnar styloid.  The styloid was delivered, redundant bone removed with a rongeur.  The bone surfaces on the ulnar styloid fragment and the distal ulna were freshened with an osteotome and saw followed by use of a rongeur.  We entered the joint and captured the superficial fibers of the triangular fibrocartilage with 3 pass grasping suture of 3-0 FiberWire.  This suture was then brought to the ulnar styloid with drill holes followed by retensioning of the superficial fibers of the triangular fibrocartilage.   The ulnar styloid nonunion fragment was then secured to the plate with a box wire and a tension band wire, creating excellent compression.  We then anatomically repaired the envelope of the ECU sub sheath and the 6th dorsal compartment.  The ulnar shortening osteoplasty was examined with a C-arm fluoroscope.  We created a neutral ulnar variance.  The triquetrum and lunate were well decompressed and the ulnar styloid nonunion was noted to be approximated in anatomic position with good compression.  The wound was then anatomically repaired with layers of 3-0 Ethibond, repairing the extensor carpi ulnaris sheath followed by subcutaneous suture of 4-0 Vicryl and intradermal 3-0 Prolene segmental sutures.  The tourniquet was in place for 1 hour and 47 minutes.  Given the degree of pathology, we identified in the ulnar carpus with the nonunion and the LT ligament tear.  In my judgment, there were not strong indications to proceed with pisiform resection at this time.  The portals were repaired with intradermal 3-0 Prolene.  The tourniquet was released with immediate capillary refill to the fingers and thumb. Matthew Newton was placed in compressive dressing with a sugar-tong splint, maintaining the forearm in 45 degrees supination.  Note for aftercare, he is provided prescriptions for ibuprofen 600 mg 1 p.o. q.6 hours p.r.n. pain, 30 tablets, 1 refill.  Also, Dilaudid 2 mg 1- 2 tablets p.o. q.4 hours p.r.n. pain, 30 tablets without refill, and Keflex 500 mg 1 p.o. q.8 hours x4 days.     Katy Fitch Matthew Newton, M.D.     RVS/MEDQ  D:  01/07/2012  T:  01/08/2012  Job:  [id number]

## 2012-11-17 ENCOUNTER — Other Ambulatory Visit: Payer: Self-pay | Admitting: Orthopedic Surgery

## 2012-11-23 NOTE — Progress Notes (Signed)
Pt here 1/13 for lt wrist surgery-did well-no labs needed

## 2012-11-28 NOTE — H&P (Signed)
  Matthew Newton is an 51 y.o. male.   Chief Complaint: c/o chronic and progressive pain and dysfunction of the right thumb HPI: He brings up some issues with his right thumb IP joint. 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 He has a large mucoid cyst. Matthew Newton only has a 20 degree arc of motion at the IP joint. I have told him to consider arthrodesis of the IP joint sometime in the fall if he decides that pain is a limiting factor. I would not recommend debridement as he will likely have residual discomfort due to his degree of osteoarthritis.   Past Medical History  Diagnosis Date  . Cancer 2008    thyroid  . GERD (gastroesophageal reflux disease)   . PONV (postoperative nausea and vomiting)     Past Surgical History  Procedure Date  . Tonsillectomy   . Thyroidectomy 2008  . Knee arthroscopy 1990 & 1991  . Hernia repair 1994    Right  . Wrist arthroscopy 01/07/2012    Procedure: ARTHROSCOPY WRIST;  Surgeon: Wyn Forster., MD;  Location: [facility];  Service: Orthopedics;  Laterality: Left;  left wrist arthroscopy with debridement triangular fibrocartlidge complex repair ulna shortening and ulna nonunion repair.    No family history on file. Social History:  reports that he has never smoked. He has never used smokeless tobacco. He reports that he does not drink alcohol or use illicit drugs.  Allergies:  Allergies  Allergen Reactions  . Contrast Media (Iodinated Diagnostic Agents) Shortness Of Breath    Pt states it "closed his chest up" and he "couldn't breathe".    No prescriptions prior to admission    No results found for this or any previous visit (from the past 48 hour(s)).  No results found.   Pertinent items are noted in HPI.  Height 5\' 9"  (1.753 m), weight 83.915 kg (185 lb).  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 He has a large mucoid cyst. Matthew Newton only has a 20 degree arc of motion at the IP joint. N/V grossly intact. 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   DASNOIT,Matthew Newton 11/28/2012, 11:50 AM     H&P documentation: 11/29/2012  -History and Physical Reviewed  -Patient has been re-examined  -No change in the plan of care  Wyn Forster, MD

## 2012-11-29 ENCOUNTER — Ambulatory Visit (HOSPITAL_BASED_OUTPATIENT_CLINIC_OR_DEPARTMENT_OTHER): Payer: 59 | Admitting: Anesthesiology

## 2012-11-29 ENCOUNTER — Encounter (HOSPITAL_BASED_OUTPATIENT_CLINIC_OR_DEPARTMENT_OTHER): Payer: Self-pay

## 2012-11-29 ENCOUNTER — Ambulatory Visit (HOSPITAL_BASED_OUTPATIENT_CLINIC_OR_DEPARTMENT_OTHER)
Admission: RE | Admit: 2012-11-29 | Discharge: 2012-11-29 | Disposition: A | Discharge status: Home / Self Care | Payer: 59 | Source: Ambulatory Visit | Attending: Orthopedic Surgery | Admitting: Orthopedic Surgery

## 2012-11-29 ENCOUNTER — Encounter (HOSPITAL_BASED_OUTPATIENT_CLINIC_OR_DEPARTMENT_OTHER): Payer: Self-pay | Admitting: Anesthesiology

## 2012-11-29 ENCOUNTER — Encounter (HOSPITAL_BASED_OUTPATIENT_CLINIC_OR_DEPARTMENT_OTHER)
Admission: RE | Disposition: A | Discharge status: Home / Self Care | Payer: Self-pay | Source: Ambulatory Visit | Attending: Orthopedic Surgery

## 2012-11-29 DIAGNOSIS — Z85850 Personal history of malignant neoplasm of thyroid: Secondary | ICD-10-CM | POA: Insufficient documentation

## 2012-11-29 DIAGNOSIS — K219 Gastro-esophageal reflux disease without esophagitis: Secondary | ICD-10-CM | POA: Insufficient documentation

## 2012-11-29 DIAGNOSIS — M19049 Primary osteoarthritis, unspecified hand: Secondary | ICD-10-CM | POA: Insufficient documentation

## 2012-11-29 HISTORY — PX: CARPOMETACARPAL (CMC) FUSION OF THUMB: SHX6290

## 2012-11-29 LAB — POCT HEMOGLOBIN-HEMACUE: Hemoglobin: 14.8 g/dL (ref 13.0–17.0)

## 2012-11-29 SURGERY — CARPOMETACARPAL (CMC) FUSION OF THUMB
Anesthesia: General | Site: Thumb | Laterality: Right | Wound class: Clean

## 2012-11-29 MED ORDER — FENTANYL CITRATE 0.05 MG/ML IJ SOLN: 50.0000 ug | INTRAMUSCULAR | Status: DC | PRN

## 2012-11-29 MED ORDER — LIDOCAINE HCL (CARDIAC) 20 MG/ML IV SOLN
INTRAVENOUS | Status: DC | PRN
  Administered 2012-11-29: 30 mg via INTRAVENOUS

## 2012-11-29 MED ORDER — MIDAZOLAM HCL 2 MG/2ML IJ SOLN: 0.5000 mg | Freq: Once | INTRAMUSCULAR | Status: DC | PRN

## 2012-11-29 MED ORDER — LACTATED RINGERS IV SOLN
INTRAVENOUS | Status: DC
  Administered 2012-11-29 (×2): via INTRAVENOUS

## 2012-11-29 MED ORDER — KETOROLAC TROMETHAMINE 30 MG/ML IJ SOLN
INTRAMUSCULAR | Status: DC | PRN
  Administered 2012-11-29: 30 mg via INTRAVENOUS

## 2012-11-29 MED ORDER — CHLORHEXIDINE GLUCONATE 4 % EX LIQD: 60.0000 mL | Freq: Once | CUTANEOUS | Status: DC

## 2012-11-29 MED ORDER — MIDAZOLAM HCL 2 MG/2ML IJ SOLN: 1.0000 mg | INTRAMUSCULAR | Status: DC | PRN

## 2012-11-29 MED ORDER — ACETAMINOPHEN 10 MG/ML IV SOLN
1000.0000 mg | Freq: Once | INTRAVENOUS | Status: AC
  Administered 2012-11-29: 1000 mg via INTRAVENOUS

## 2012-11-29 MED ORDER — OXYCODONE HCL 5 MG/5ML PO SOLN: 5.0000 mg | Freq: Once | ORAL | Status: DC | PRN

## 2012-11-29 MED ORDER — CEPHALEXIN 500 MG PO CAPS: 500.0000 mg | ORAL_CAPSULE | Freq: Three times a day (TID) | ORAL | Status: DC

## 2012-11-29 MED ORDER — LIDOCAINE HCL 2 % IJ SOLN
INTRAMUSCULAR | Status: DC | PRN
  Administered 2012-11-29: 6 mL

## 2012-11-29 MED ORDER — DEXAMETHASONE SODIUM PHOSPHATE 4 MG/ML IJ SOLN
INTRAMUSCULAR | Status: DC | PRN
  Administered 2012-11-29: 10 mg via INTRAVENOUS

## 2012-11-29 MED ORDER — OXYCODONE HCL 5 MG PO TABS: 5.0000 mg | ORAL_TABLET | Freq: Once | ORAL | Status: DC | PRN

## 2012-11-29 MED ORDER — PROMETHAZINE HCL 25 MG/ML IJ SOLN: 6.2500 mg | INTRAMUSCULAR | Status: DC | PRN

## 2012-11-29 MED ORDER — ACETAMINOPHEN 10 MG/ML IV SOLN
INTRAVENOUS | Status: DC | PRN
  Administered 2012-11-29: 1000 mg via INTRAVENOUS

## 2012-11-29 MED ORDER — HYDROMORPHONE HCL PF 1 MG/ML IJ SOLN: 0.2500 mg | INTRAMUSCULAR | Status: DC | PRN

## 2012-11-29 MED ORDER — MEPERIDINE HCL 25 MG/ML IJ SOLN: 6.2500 mg | INTRAMUSCULAR | Status: DC | PRN

## 2012-11-29 MED ORDER — ONDANSETRON HCL 4 MG/2ML IJ SOLN
INTRAMUSCULAR | Status: DC | PRN
  Administered 2012-11-29: 4 mg via INTRAVENOUS

## 2012-11-29 MED ORDER — FENTANYL CITRATE 0.05 MG/ML IJ SOLN
INTRAMUSCULAR | Status: DC | PRN
  Administered 2012-11-29 (×2): 50 ug via INTRAVENOUS

## 2012-11-29 MED ORDER — OXYCODONE-ACETAMINOPHEN 5-325 MG PO TABS: ORAL_TABLET | ORAL | Status: DC

## 2012-11-29 MED ORDER — PROPOFOL 10 MG/ML IV BOLUS
INTRAVENOUS | Status: DC | PRN
  Administered 2012-11-29: 300 mg via INTRAVENOUS

## 2012-11-29 MED ORDER — MIDAZOLAM HCL 5 MG/5ML IJ SOLN
INTRAMUSCULAR | Status: DC | PRN
  Administered 2012-11-29: 2 mg via INTRAVENOUS

## 2012-11-29 MED ORDER — CEFAZOLIN SODIUM-DEXTROSE 2-3 GM-% IV SOLR
2.0000 g | Freq: Once | INTRAVENOUS | Status: AC
  Administered 2012-11-29: 2 g via INTRAVENOUS

## 2012-11-29 SURGICAL SUPPLY — 77 items
BANDAGE ADHESIVE 1X3 (GAUZE/BANDAGES/DRESSINGS) IMPLANT
BANDAGE CONFORM 3  STR LF (GAUZE/BANDAGES/DRESSINGS) IMPLANT
BANDAGE ELASTIC 3 VELCRO ST LF (GAUZE/BANDAGES/DRESSINGS) ×2 IMPLANT
BANDAGE GAUZE ELAST BULKY 4 IN (GAUZE/BANDAGES/DRESSINGS) ×1 IMPLANT
BIT DRILL ACUTRAK 2 MINI (BIT) IMPLANT
BLADE MINI RND TIP GREEN BEAV (BLADE) ×1 IMPLANT
BLADE SURG 15 STRL LF DISP TIS (BLADE) ×1 IMPLANT
BLADE SURG 15 STRL SS (BLADE) ×2
BNDG CMPR 9X4 STRL LF SNTH (GAUZE/BANDAGES/DRESSINGS) ×1
BNDG CMPR MD 5X2 ELC HKLP STRL (GAUZE/BANDAGES/DRESSINGS) ×1
BNDG ELASTIC 2 VLCR STRL LF (GAUZE/BANDAGES/DRESSINGS) ×1 IMPLANT
BNDG ESMARK 4X9 LF (GAUZE/BANDAGES/DRESSINGS) ×2 IMPLANT
BRUSH SCRUB EZ PLAIN DRY (MISCELLANEOUS) ×2 IMPLANT
BUR EGG/OVAL CARBIDE (BURR) ×1 IMPLANT
BUR FAST CUTTING MED (BURR) IMPLANT
CLOTH BEACON ORANGE TIMEOUT ST (SAFETY) ×2 IMPLANT
CORDS BIPOLAR (ELECTRODE) ×2 IMPLANT
COVER MAYO STAND STRL (DRAPES) ×2 IMPLANT
COVER TABLE BACK 60X90 (DRAPES) ×2 IMPLANT
CUFF TOURNIQUET SINGLE 18IN (TOURNIQUET CUFF) IMPLANT
CUFF TOURNIQUET SINGLE 24IN (TOURNIQUET CUFF) ×1 IMPLANT
DECANTER SPIKE VIAL GLASS SM (MISCELLANEOUS) IMPLANT
DRAPE EXTREMITY T 121X128X90 (DRAPE) ×2 IMPLANT
DRAPE OEC MINIVIEW 54X84 (DRAPES) ×2 IMPLANT
DRAPE SURG 17X23 STRL (DRAPES) ×2 IMPLANT
DRILL ACUTRAK 2 MINI (BIT) ×2
GAUZE XEROFORM 1X8 LF (GAUZE/BANDAGES/DRESSINGS) IMPLANT
GLOVE BIO SURGEON STRL SZ 6.5 (GLOVE) ×1 IMPLANT
GLOVE BIOGEL M STRL SZ7.5 (GLOVE) ×2 IMPLANT
GLOVE BIOGEL PI IND STRL 7.0 (GLOVE) IMPLANT
GLOVE BIOGEL PI IND STRL 8 (GLOVE) ×1 IMPLANT
GLOVE BIOGEL PI INDICATOR 7.0 (GLOVE) ×1
GLOVE BIOGEL PI INDICATOR 8 (GLOVE) ×1
GLOVE EXAM NITRILE EXT CUFF MD (GLOVE) ×1 IMPLANT
GLOVE ORTHO TXT STRL SZ7.5 (GLOVE) ×2 IMPLANT
GOWN PREVENTION PLUS XLARGE (GOWN DISPOSABLE) ×2 IMPLANT
GOWN PREVENTION PLUS XXLARGE (GOWN DISPOSABLE) ×4 IMPLANT
K-WIRE 4.0X.028 (WIRE) IMPLANT
KWIRE 4.0 X .035IN (WIRE) IMPLANT
LOOP VESSEL MAXI BLUE (MISCELLANEOUS) IMPLANT
NDL HYPO 25X1 1.5 SAFETY (NEEDLE) IMPLANT
NEEDLE 27GAX1X1/2 (NEEDLE) IMPLANT
NEEDLE HYPO 25X1 1.5 SAFETY (NEEDLE) IMPLANT
NS IRRIG 1000ML POUR BTL (IV SOLUTION) ×2 IMPLANT
PACK BASIN DAY SURGERY FS (CUSTOM PROCEDURE TRAY) ×2 IMPLANT
PAD CAST 3X4 CTTN HI CHSV (CAST SUPPLIES) ×1 IMPLANT
PADDING CAST ABS 4INX4YD NS (CAST SUPPLIES) ×1
PADDING CAST ABS COTTON 4X4 ST (CAST SUPPLIES) ×1 IMPLANT
PADDING CAST COTTON 3X4 STRL (CAST SUPPLIES) ×2
PADDING UNDERCAST 2  STERILE (CAST SUPPLIES) ×2 IMPLANT
SCREW ACUTRAK 2 MINI 30MM (Screw) ×1 IMPLANT
SLEEVE SCD COMPRESS KNEE MED (MISCELLANEOUS) ×1 IMPLANT
SPLINT PLASTER CAST XFAST 3X15 (CAST SUPPLIES) ×4 IMPLANT
SPLINT PLASTER XTRA FASTSET 3X (CAST SUPPLIES) ×10
SPONGE GAUZE 4X4 12PLY (GAUZE/BANDAGES/DRESSINGS) ×2 IMPLANT
STOCKINETTE 4X48 STRL (DRAPES) ×2 IMPLANT
STRIP CLOSURE SKIN 1/2X4 (GAUZE/BANDAGES/DRESSINGS) ×1 IMPLANT
SUT ETHILON 4 0 CL P 3 (SUTURE) ×1 IMPLANT
SUT ETHILON 5 0 P 3 18 (SUTURE)
SUT FIBERWIRE 3-0 18 TAPR NDL (SUTURE)
SUT MERSILENE 4 0 P 3 (SUTURE) IMPLANT
SUT NYLON ETHILON 5-0 P-3 1X18 (SUTURE) IMPLANT
SUT PROLENE 3 0 PS 2 (SUTURE) ×2 IMPLANT
SUT PROLENE 4 0 P 3 18 (SUTURE) ×1 IMPLANT
SUT STEEL 0 (SUTURE)
SUT STEEL 0 18XMFL TIE 17 (SUTURE) IMPLANT
SUT VIC AB 4-0 P-3 18XBRD (SUTURE) ×1 IMPLANT
SUT VIC AB 4-0 P3 18 (SUTURE) ×2
SUTURE FIBERWR 3-0 18 TAPR NDL (SUTURE) IMPLANT
SYR 3ML 23GX1 SAFETY (SYRINGE) IMPLANT
SYR BULB 3OZ (MISCELLANEOUS) ×2 IMPLANT
SYR CONTROL 10ML LL (SYRINGE) ×2 IMPLANT
TOWEL OR 17X24 6PK STRL BLUE (TOWEL DISPOSABLE) ×4 IMPLANT
TRAY DSU PREP LF (CUSTOM PROCEDURE TRAY) ×2 IMPLANT
TUBE CONNECTING 20X1/4 (TUBING) ×2 IMPLANT
UNDERPAD 30X30 INCONTINENT (UNDERPADS AND DIAPERS) ×2 IMPLANT
WATER STERILE IRR 1000ML POUR (IV SOLUTION) ×2 IMPLANT

## 2012-11-29 NOTE — Anesthesia Preprocedure Evaluation (Signed)
Anesthesia Evaluation  Patient identified by MRN, date of birth, ID band Patient awake    Reviewed: Allergy & Precautions, H&P , NPO status , Patient's Chart, lab work & pertinent test results  History of Anesthesia Complications Negative for: history of anesthetic complications  Airway Mallampati: I TM Distance: >3 FB Neck ROM: Full    Dental No notable dental hx. (+) Teeth Intact and Dental Advisory Given   Pulmonary neg pulmonary ROS,  breath sounds clear to auscultation  Pulmonary exam normal       Cardiovascular negative cardio ROS  Rhythm:Regular Rate:Normal     Neuro/Psych negative neurological ROS  negative psych ROS   GI/Hepatic Neg liver ROS, GERD-  Controlled,  Endo/Other  negative endocrine ROS  Renal/GU negative Renal ROS     Musculoskeletal   Abdominal   Peds  Hematology negative hematology ROS (+)   Anesthesia Other Findings   Reproductive/Obstetrics                           Anesthesia Physical Anesthesia Plan  ASA: I  Anesthesia Plan: General   Post-op Pain Management:    Induction: Intravenous  Airway Management Planned: LMA  Additional Equipment:   Intra-op Plan:   Post-operative Plan:   Informed Consent: I have reviewed the patients History and Physical, chart, labs and discussed the procedure including the risks, benefits and alternatives for the proposed anesthesia with the patient or authorized representative who has indicated his/her understanding and acceptance.   Dental advisory given  Plan Discussed with: Surgeon and CRNA  Anesthesia Plan Comments: (Plan routine monitors, GA- LMA OK)        Anesthesia Quick Evaluation

## 2012-11-29 NOTE — Op Note (Signed)
[id number] 

## 2012-11-29 NOTE — Brief Op Note (Signed)
11/29/2012  8:17 AM  PATIENT:  Matthew Newton  51 y.o. male  PRE-OPERATIVE DIAGNOSIS:  mucoid cyst loose body right thumb end stage DJD  POST-OPERATIVE DIAGNOSIS: end stage arthritis of right thumb interphalangeal joint  PROCEDURE:  Synovectomy and arthrodesis of right thumb interphalangeal joint with mini Acutrak fixation  SURGEON:  Surgeon(s) and Role:    * Wyn Forster., MD - Primary  PHYSICIAN ASSISTANT:   ASSISTANTS: Mallory Shirk.A-C   ANESTHESIA:   general  EBL:     BLOOD ADMINISTERED:none  DRAINS: none   LOCAL MEDICATIONS USED:  LIDOCAINE   SPECIMEN:  No Specimen  DISPOSITION OF SPECIMEN:  N/A  COUNTS:  YES  TOURNIQUET:  * Missing tourniquet times found for documented tourniquets in log:  [id number] *  DICTATION: .Other Dictation: Dictation Number [id number]  PLAN OF CARE: Discharge to home after PACU  PATIENT DISPOSITION:  PACU - hemodynamically stable.

## 2012-11-29 NOTE — Transfer of Care (Signed)
Immediate Anesthesia Transfer of Care Note  Patient: Matthew Newton  Procedure(s) Performed: Procedure(s) (LRB): CARPOMETACARPAL (CMC) FUSION OF THUMB (Right)  Patient Location: PACU  Anesthesia Type: General  Level of Consciousness: awake, alert  and oriented  Airway & Oxygen Therapy: Patient Spontanous Breathing and Patient connected to face mask oxygen  Post-op Assessment: Report given to PACU RN and Post -op Vital signs reviewed and stable  Post vital signs: Reviewed and stable  Complications: No apparent anesthesia complications

## 2012-11-29 NOTE — Op Note (Signed)
NAME:  Matthew Newton, BRIGHTWELL NO.:  [id number]  MEDICAL RECORD NO.:  [id number]  LOCATION:                                 FACILITY:  PHYSICIAN:  Katy Fitch. Dezarae Mcclaran, M.D. DATE OF BIRTH:  [birth year]  DATE OF PROCEDURE:  11/29/2012 DATE OF DISCHARGE:                              OPERATIVE REPORT   PREOPERATIVE DIAGNOSES:  End-stage degenerative arthritis, right thumb IP joint with large marginal osteophytes and synovitis.  POSTOPERATIVE DIAGNOSES:  End-stage degenerative arthritis, right thumb IP joint with large marginal osteophytes and synovitis.  OPERATION:  Synovectomy and arthrodesis of right thumb interphalangeal joint with mini Acutrak intramedullary screw fixation.  OPERATING SURGEON:  Katy Fitch. Jazarah Capili, MD  ASSISTANT:  Marveen Reeks Dasnoit, PA-C  ANESTHESIA:  General by LMA.  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  During our aftercare for his wrist injury, he discussed severe pain in his right thumb IP joint with large marginal osteophytes and synovitis.  We had detailed informed consent, reporting that while we could perform a synovectomy, it would not likely resolve his pain.  We recommended he consider arthrodesis of his right thumb IP joint.  Mr. [name] considered this for several months and returned requesting fusion of his right thumb interphalangeal joint.  After informed consent, he was brought to the operating at this time.  He understands the risks include infection, hardware failure, failure to fuse, failure to relieve all his pain.  He understands this will not affect the osteoarthritis of his other joints.  Questions were invited and answered in detail in the holding area.  PROCEDURE:  Matthew Newton was brought to room 6 of [facility], placed in supine  position on the operating table.  In the holding area, proper surgical site identification was conducted per protocol with marking of his right thumb.  He had detailed anesthesia informed consent by Dr. Jairo Ben.  In room 6 under Dr. Edison Pace direct supervision, general anesthesia by LMA technique was induced.  Betadine scrub and paint of the right upper extremity was accomplished followed by application of sterile stockinette and impervious arthroscopy drapes.  Ancef 2 g were administered as an IV prophylactic antibiotic and IV acetaminophen was provided for perioperative analgesia.  Following exsanguination of the right arm with an Esmarch bandage, arterial tourniquet on the proximal right brachium was inflated to 250 mmHg.  Following routine surgical time-out, procedure commenced with a curvilinear incision exposing the extensor mechanism overlying the right thumb interphalangeal joint.  The extensor tendon was transected 3 mm proximal to its insertion at the base of the distal phalanx followed by capsulotomy and release of the radial and ulnar collateral ligament. The joint was opened in shotgun style revealing bone-on-bone arthropathy with very large marginal osteophytes.  A rongeur was used to debride all the osteophytes off the proximal phalangeal head, creating a bullet shaped epiphysis.  A power bur was used to create a identical contour at the base of the distal phalanx.  The joint was positioned at 10 degrees flexion and  a few degrees of radial deviation to facilitate pulp to pulp pinch with slight pronation once again to facilitate pulp to pulp pinch.  A 0.045-inch Kirschner wire was placed with retrograde technique, taking care to angle the screw to provide optimum support against the ulnar cortex of the proximal phalanx.  We subsequently placed a 30 mm mini Acutrak screw with standard technique securing the arthrodesis in a very satisfactory position.  AP  lateral and multiple oblique images were obtained demonstrating excellent bone-on-bone opposition and good screw placement.  The wound was then irrigated and supplemental bone graft was placed from the proximal phalangeal head and the base of the distal phalanx around the fusion site.  The extensor was then repaired with core suture of 4-0 Mersilene followed by finishing suture of 4-0 Mersilene.  The skin was repaired with segmental intradermal 4-0 Prolene.  The distal wound will simply be dressed with Xeroflo.  The incision was dressed with Steri-Strips followed by sterile gauze, sterile Webril, and a forearm-based thumb spica splint.  There were no apparent complications.     Katy Fitch Yahia Bottger, M.D.     RVS/MEDQ  D:  11/29/2012  T:  11/29/2012  Job:  [id number]

## 2012-11-29 NOTE — Anesthesia Procedure Notes (Signed)
Procedure Name: LMA Insertion Date/Time: 11/29/2012 7:45 AM Performed by: Norva Pavlov Pre-anesthesia Checklist: Patient identified, Emergency Drugs available, Suction available and Patient being monitored Patient Re-evaluated:Patient Re-evaluated prior to inductionOxygen Delivery Method: Circle System Utilized Preoxygenation: Pre-oxygenation with 100% oxygen Intubation Type: IV induction Ventilation: Mask ventilation without difficulty LMA: LMA inserted LMA Size: 4.0 Number of attempts: 1 Airway Equipment and Method: bite block Placement Confirmation: positive ETCO2 Tube secured with: Tape Dental Injury: Teeth and Oropharynx as per pre-operative assessment

## 2012-11-29 NOTE — Anesthesia Postprocedure Evaluation (Signed)
  Anesthesia Post-op Note  Patient: Matthew Newton  Procedure(s) Performed: Procedure(s) (LRB) with comments: CARPOMETACARPAL (CMC) FUSION OF THUMB (Right) - Fusion Right Thumb IP Joint   Patient Location: PACU  Anesthesia Type:General  Level of Consciousness: awake, alert , oriented and patient cooperative  Airway and Oxygen Therapy: Patient Spontanous Breathing  Post-op Pain: none  Post-op Assessment: Post-op Vital signs reviewed, Patient's Cardiovascular Status Stable, Respiratory Function Stable, Patent Airway, No signs of Nausea or vomiting and Pain level controlled  Post-op Vital Signs: Reviewed and stable  Complications: No apparent anesthesia complications

## 2012-11-30 ENCOUNTER — Encounter (HOSPITAL_BASED_OUTPATIENT_CLINIC_OR_DEPARTMENT_OTHER): Payer: Self-pay | Admitting: Orthopedic Surgery

## 2012-12-15 ENCOUNTER — Other Ambulatory Visit: Payer: Self-pay | Admitting: Endocrinology

## 2012-12-15 DIAGNOSIS — C73 Malignant neoplasm of thyroid gland: Secondary | ICD-10-CM

## 2013-01-23 ENCOUNTER — Encounter (HOSPITAL_COMMUNITY)
Admission: RE | Admit: 2013-01-23 | Discharge: 2013-01-23 | Disposition: A | Discharge status: Home / Self Care | Payer: 59 | Source: Ambulatory Visit | Attending: Endocrinology | Admitting: Endocrinology

## 2013-01-23 DIAGNOSIS — C73 Malignant neoplasm of thyroid gland: Secondary | ICD-10-CM

## 2013-01-23 DIAGNOSIS — Z85850 Personal history of malignant neoplasm of thyroid: Secondary | ICD-10-CM | POA: Insufficient documentation

## 2013-01-23 MED ORDER — THYROTROPIN ALFA 1.1 MG IM SOLR
0.9000 mg | INTRAMUSCULAR | Status: AC
  Administered 2013-01-23: 0.9 mg via INTRAMUSCULAR
  Filled 2013-01-23: qty 0.9

## 2013-01-24 ENCOUNTER — Encounter (HOSPITAL_COMMUNITY)
Admission: RE | Admit: 2013-01-24 | Discharge: 2013-01-24 | Disposition: A | Discharge status: Home / Self Care | Payer: 59 | Source: Ambulatory Visit | Attending: Endocrinology | Admitting: Endocrinology

## 2013-01-24 MED ORDER — THYROTROPIN ALFA 1.1 MG IM SOLR
0.9000 mg | INTRAMUSCULAR | Status: AC
  Administered 2013-01-24: 0.9 mg via INTRAMUSCULAR
  Filled 2013-01-24: qty 0.9

## 2013-01-25 ENCOUNTER — Encounter (HOSPITAL_COMMUNITY)
Admission: RE | Admit: 2013-01-25 | Discharge: 2013-01-25 | Disposition: A | Discharge status: Home / Self Care | Payer: 59 | Source: Ambulatory Visit | Attending: Endocrinology | Admitting: Endocrinology

## 2013-01-27 ENCOUNTER — Encounter (HOSPITAL_COMMUNITY)
Admission: RE | Admit: 2013-01-27 | Discharge: 2013-01-27 | Disposition: A | Discharge status: Home / Self Care | Payer: 59 | Source: Ambulatory Visit | Attending: Endocrinology | Admitting: Endocrinology

## 2013-01-27 MED ORDER — SODIUM IODIDE I 131 CAPSULE
4.2000 | Freq: Once | INTRAVENOUS | Status: AC | PRN
  Administered 2013-01-27: 4.2 via ORAL

## 2014-05-28 ENCOUNTER — Other Ambulatory Visit: Payer: Self-pay | Admitting: Dermatology

## 2014-08-03 ENCOUNTER — Other Ambulatory Visit: Payer: Self-pay | Admitting: Orthopedic Surgery

## 2014-08-03 DIAGNOSIS — R2232 Localized swelling, mass and lump, left upper limb: Secondary | ICD-10-CM

## 2014-08-08 ENCOUNTER — Ambulatory Visit
Admission: RE | Admit: 2014-08-08 | Discharge: 2014-08-08 | Disposition: A | Discharge status: Home / Self Care | Payer: 59 | Source: Ambulatory Visit | Attending: Orthopedic Surgery | Admitting: Orthopedic Surgery

## 2014-08-08 DIAGNOSIS — R2232 Localized swelling, mass and lump, left upper limb: Secondary | ICD-10-CM

## 2014-08-08 MED ORDER — GADOBENATE DIMEGLUMINE 529 MG/ML IV SOLN
10.0000 mL | Freq: Once | INTRAVENOUS | Status: AC | PRN
  Administered 2014-08-08: 10 mL via INTRAVENOUS

## 2014-11-05 ENCOUNTER — Other Ambulatory Visit: Payer: Self-pay | Admitting: Dermatology

## 2016-02-13 ENCOUNTER — Other Ambulatory Visit: Payer: Self-pay | Admitting: Endocrinology

## 2016-02-13 DIAGNOSIS — R599 Enlarged lymph nodes, unspecified: Secondary | ICD-10-CM

## 2016-02-17 ENCOUNTER — Ambulatory Visit
Admission: RE | Admit: 2016-02-17 | Discharge: 2016-02-17 | Disposition: A | Discharge status: Home / Self Care | Payer: Self-pay | Source: Ambulatory Visit | Attending: Endocrinology | Admitting: Endocrinology

## 2016-02-17 DIAGNOSIS — R599 Enlarged lymph nodes, unspecified: Secondary | ICD-10-CM

## 2016-02-25 ENCOUNTER — Other Ambulatory Visit: Payer: Self-pay | Admitting: Endocrinology

## 2016-02-25 DIAGNOSIS — R52 Pain, unspecified: Secondary | ICD-10-CM

## 2016-02-25 DIAGNOSIS — R220 Localized swelling, mass and lump, head: Secondary | ICD-10-CM

## 2016-02-25 DIAGNOSIS — R609 Edema, unspecified: Secondary | ICD-10-CM

## 2016-03-02 ENCOUNTER — Ambulatory Visit
Admission: RE | Admit: 2016-03-02 | Discharge: 2016-03-02 | Disposition: A | Discharge status: Home / Self Care | Payer: 59 | Source: Ambulatory Visit | Attending: Endocrinology | Admitting: Endocrinology

## 2016-03-02 DIAGNOSIS — R220 Localized swelling, mass and lump, head: Secondary | ICD-10-CM

## 2016-03-02 DIAGNOSIS — R52 Pain, unspecified: Secondary | ICD-10-CM

## 2016-03-02 DIAGNOSIS — R609 Edema, unspecified: Secondary | ICD-10-CM

## 2016-12-30 DIAGNOSIS — R05 Cough: Secondary | ICD-10-CM | POA: Diagnosis not present

## 2017-01-29 DIAGNOSIS — M76892 Other specified enthesopathies of left lower limb, excluding foot: Secondary | ICD-10-CM | POA: Diagnosis not present

## 2017-02-05 DIAGNOSIS — E890 Postprocedural hypothyroidism: Secondary | ICD-10-CM | POA: Diagnosis not present

## 2017-02-05 DIAGNOSIS — Z0000 Encounter for general adult medical examination without abnormal findings: Secondary | ICD-10-CM | POA: Diagnosis not present

## 2017-02-05 DIAGNOSIS — E559 Vitamin D deficiency, unspecified: Secondary | ICD-10-CM | POA: Diagnosis not present

## 2017-02-12 ENCOUNTER — Other Ambulatory Visit: Payer: Self-pay | Admitting: Sports Medicine

## 2017-02-12 DIAGNOSIS — C73 Malignant neoplasm of thyroid gland: Secondary | ICD-10-CM | POA: Diagnosis not present

## 2017-02-12 DIAGNOSIS — E890 Postprocedural hypothyroidism: Secondary | ICD-10-CM | POA: Diagnosis not present

## 2017-02-12 DIAGNOSIS — M545 Low back pain: Secondary | ICD-10-CM

## 2017-02-26 ENCOUNTER — Ambulatory Visit
Admission: RE | Admit: 2017-02-26 | Discharge: 2017-02-26 | Disposition: A | Discharge status: Home / Self Care | Payer: 59 | Source: Ambulatory Visit | Attending: Sports Medicine | Admitting: Sports Medicine

## 2017-02-26 DIAGNOSIS — M48061 Spinal stenosis, lumbar region without neurogenic claudication: Secondary | ICD-10-CM | POA: Diagnosis not present

## 2017-02-26 DIAGNOSIS — M545 Low back pain: Secondary | ICD-10-CM

## 2017-03-03 DIAGNOSIS — M76892 Other specified enthesopathies of left lower limb, excluding foot: Secondary | ICD-10-CM | POA: Diagnosis not present

## 2017-03-05 DIAGNOSIS — M25562 Pain in left knee: Secondary | ICD-10-CM | POA: Diagnosis not present

## 2017-04-15 DIAGNOSIS — E890 Postprocedural hypothyroidism: Secondary | ICD-10-CM | POA: Diagnosis not present

## 2017-06-02 DIAGNOSIS — D225 Melanocytic nevi of trunk: Secondary | ICD-10-CM | POA: Diagnosis not present

## 2017-06-02 DIAGNOSIS — L821 Other seborrheic keratosis: Secondary | ICD-10-CM | POA: Diagnosis not present

## 2017-06-02 DIAGNOSIS — D1801 Hemangioma of skin and subcutaneous tissue: Secondary | ICD-10-CM | POA: Diagnosis not present

## 2017-09-28 DIAGNOSIS — Z23 Encounter for immunization: Secondary | ICD-10-CM | POA: Diagnosis not present

## 2017-10-18 ENCOUNTER — Ambulatory Visit: Payer: 59 | Admitting: Family Medicine

## 2017-10-18 ENCOUNTER — Ambulatory Visit: Payer: 59 | Admitting: Internal Medicine

## 2017-10-28 ENCOUNTER — Ambulatory Visit (INDEPENDENT_AMBULATORY_CARE_PROVIDER_SITE_OTHER): Payer: 59 | Admitting: Internal Medicine

## 2017-10-28 ENCOUNTER — Encounter: Payer: Self-pay | Admitting: Internal Medicine

## 2017-10-28 VITALS — BP 122/76 | HR 67 | Temp 98.2°F | Ht 68.5 in | Wt 202.0 lb

## 2017-10-28 DIAGNOSIS — Z125 Encounter for screening for malignant neoplasm of prostate: Secondary | ICD-10-CM | POA: Diagnosis not present

## 2017-10-28 DIAGNOSIS — Z0000 Encounter for general adult medical examination without abnormal findings: Secondary | ICD-10-CM

## 2017-10-28 DIAGNOSIS — H6121 Impacted cerumen, right ear: Secondary | ICD-10-CM | POA: Diagnosis not present

## 2017-10-28 DIAGNOSIS — Z1159 Encounter for screening for other viral diseases: Secondary | ICD-10-CM

## 2017-10-28 DIAGNOSIS — J302 Other seasonal allergic rhinitis: Secondary | ICD-10-CM | POA: Diagnosis not present

## 2017-10-28 DIAGNOSIS — Z114 Encounter for screening for human immunodeficiency virus [HIV]: Secondary | ICD-10-CM

## 2017-10-28 DIAGNOSIS — K219 Gastro-esophageal reflux disease without esophagitis: Secondary | ICD-10-CM

## 2017-10-28 DIAGNOSIS — E039 Hypothyroidism, unspecified: Secondary | ICD-10-CM | POA: Insufficient documentation

## 2017-10-28 DIAGNOSIS — E890 Postprocedural hypothyroidism: Secondary | ICD-10-CM

## 2017-10-28 LAB — CBC
HCT: 42.8 % (ref 39.0–52.0)
Hemoglobin: 14.2 g/dL (ref 13.0–17.0)
MCHC: 33 g/dL (ref 30.0–36.0)
MCV: 90 fl (ref 78.0–100.0)
Platelets: 261 10*3/uL (ref 150.0–400.0)
RBC: 4.76 Mil/uL (ref 4.22–5.81)
RDW: 14.2 % (ref 11.5–15.5)
WBC: 4.9 10*3/uL (ref 4.0–10.5)

## 2017-10-28 LAB — COMPREHENSIVE METABOLIC PANEL
ALT: 11 U/L (ref 0–53)
AST: 19 U/L (ref 0–37)
Albumin: 4.4 g/dL (ref 3.5–5.2)
Alkaline Phosphatase: 40 U/L (ref 39–117)
BUN: 12 mg/dL (ref 6–23)
CO2: 32 meq/L (ref 19–32)
CREATININE: 1.26 mg/dL (ref 0.40–1.50)
Calcium: 9.4 mg/dL (ref 8.4–10.5)
Chloride: 104 mEq/L (ref 96–112)
GFR: 62.5 mL/min (ref >60.00)
GLUCOSE: 91 mg/dL (ref 70–99)
Potassium: 4.9 mEq/L (ref 3.5–5.1)
SODIUM: 141 meq/L (ref 135–145)
Total Bilirubin: 0.7 mg/dL (ref 0.2–1.2)
Total Protein: 7 g/dL (ref 6.0–8.3)

## 2017-10-28 LAB — LIPID PANEL
Cholesterol: 195 mg/dL (ref 0–200)
HDL: 45.4 mg/dL (ref >39.00)
LDL Cholesterol: 134 mg/dL — ABNORMAL HIGH (ref 0–99)
NONHDL: 149.13
Total CHOL/HDL Ratio: 4
Triglycerides: 75 mg/dL (ref 0.0–149.0)
VLDL: 15 mg/dL (ref 0.0–40.0)

## 2017-10-28 LAB — PSA: PSA: 0.9 ng/mL (ref 0.10–4.00)

## 2017-10-28 LAB — TSH: TSH: 0.61 u[IU]/mL (ref 0.35–4.50)

## 2017-10-28 LAB — T4, FREE: FREE T4: 1.24 ng/dL (ref 0.60–1.60)

## 2017-10-28 NOTE — Progress Notes (Signed)
HPI  Pt presents to the clinic today to establish care and for management of the conditions listed below. He is transferring care from [facility]. He would like his annual exam today.  History of Thyroid Cancer: s/p thyroidectomy in 2008. He is currentling taking Synthroid daily as prescribed. He denies any issues on his current dose of Synthroid. He follows with Dr. Chalmers Cater.  GERD: Triggered by. He is taking Dexilant daily with good relief. He denies breakthrough symptoms   Seasonal Allergies: Triggered by cat dander. He takes Zyrtec as needed with good relief.   Flu: 10/2017 Tetanus: 09/2017 PSA Screening: unsure Colon Screening: ? 2015, Dr. Penelope Coop Vision Screening: annuall Dentist: biannually   Past Medical History:  Diagnosis Date  . Cancer [facility]) 2008   thyroid  . GERD (gastroesophageal reflux disease)   . PONV (postoperative nausea and vomiting)     Current Outpatient Prescriptions  Medication Sig Dispense Refill  . cetirizine (ZYRTEC) 10 MG tablet Take 10 mg by mouth daily.      Marland Kitchen dexlansoprazole (DEXILANT) 60 MG capsule Take 60 mg by mouth daily.      Marland Kitchen levothyroxine (SYNTHROID, LEVOTHROID) 112 MCG tablet Take 112 mcg by mouth daily.      . multivitamin-iron-minerals-folic acid (CENTRUM) chewable tablet Chew 1 tablet by mouth daily.      Marland Kitchen oxyCODONE-acetaminophen (PERCOCET/ROXICET) 5-325 MG per tablet 1 or 2 tabs every 4 hours as needed for pain 30 tablet 0   No current facility-administered medications for this visit.     Allergies  Allergen Reactions  . Contrast Media [Iodinated Diagnostic Agents] Shortness Of Breath    Pt states it "closed his chest up" and he "couldn't breathe".    No family history on file.  Social History   Social History  . Marital status: Married    Spouse name: N/A  . Number of children: N/A  . Years of education: N/A   Occupational History  . Not on file.   Social History Main Topics  . Smoking status: Never Smoker  .  Smokeless tobacco: Never Used  . Alcohol use No  . Drug use: No  . Sexual activity: Not on file   Other Topics Concern  . Not on file   Social History Narrative  . No narrative on file    ROS:  Constitutional: Denies fever, malaise, fatigue, headache or abrupt weight changes.  HEENT: Denies eye pain, eye redness, ear pain, ringing in the ears, wax buildup, runny nose, nasal congestion, bloody nose, or sore throat. Respiratory: Denies difficulty breathing, shortness of breath, cough or sputum production.   Cardiovascular: Denies chest pain, chest tightness, palpitations or swelling in the hands or feet.  Gastrointestinal: Denies abdominal pain, bloating, constipation, diarrhea or blood in the stool.  GU: Denies frequency, urgency, pain with urination, blood in urine, odor or discharge. Musculoskeletal: Pt reports intermittent neck pain (reports he has a bulging disc). Denies decrease in range of motion, difficulty with gait, muscle pain or joint pain and swelling.  Skin: Denies redness, rashes, lesions or ulcercations.  Neurological: Denies dizziness, difficulty with memory, difficulty with speech or problems with balance and coordination.  Psych: Denies anxiety, depression, SI/HI.  No other specific complaints in a complete review of systems (except as listed in HPI above).  PE:  BP 122/76   Pulse 67   Temp 98.2 F (36.8 C) (Oral)   Ht 5' 8.5" (1.74 m)   Wt 202 lb (91.6 kg)   SpO2 98%  BMI 30.27 kg/m   Wt Readings from Last 3 Encounters:  10/28/17 202 lb (91.6 kg)  11/29/12 185 lb (83.9 kg)  01/04/12 185 lb (83.9 kg)    General: Appears his stated age, well developed, well nourished in NAD. Skin: Scar noted on left anterolateral neck. HEENT: Head: normal shape and size; Eyes: sclera white, no icterus, conjunctiva pink, PERRLA and EOMs intact; Left Ear: Tm gray and intact, normal light reflex; Right Ear: cerumen impaction; Throat/Mouth: Teeth present, mucosa pink and  moist, no lesions or ulcerations noted.  Neck: Neck supple, trachea midline. No masses, lumps  present.  Cardiovascular: Normal rate and rhythm. S1,S2 noted.  No murmur, rubs or gallops noted. No JVD or BLE edema. No carotid bruits noted. Pulmonary/Chest: Normal effort and positive vesicular breath sounds. No respiratory distress. No wheezes, rales or ronchi noted.  Abdomen: Soft and nontender. Normal bowel sounds, no bruits noted. No distention or masses noted. Liver, spleen and kidneys non palpable. Musculoskeletal: Strength 5/5 BUE/BLE. No signs of joint swelling. No difficulty with gait.  Neurological: Alert and oriented. Cranial nerves II-XII grossly intact. Coordination normal.  Psychiatric: Mood and affect normal. Behavior is normal. Judgment and thought content normal.     BMET    Component Value Date/Time   NA 138 07/19/2007 1020   K 4.7 07/19/2007 1020   CL 105 07/19/2007 1020   CO2 30 07/19/2007 1020   GLUCOSE 88 07/19/2007 1020   BUN 5 (L) 07/19/2007 1020   CREATININE 1.14 07/19/2007 1020   CALCIUM 8.5 07/25/2007 0640   GFRNONAA >60 07/19/2007 1020   GFRAA  07/19/2007 1020    >60        The eGFR has been calculated using the MDRD equation. This calculation has not been validated in all clinical    Lipid Panel  No results found for: CHOL, TRIG, HDL, CHOLHDL, VLDL, LDLCALC  CBC    Component Value Date/Time   WBC 6.6 07/19/2007 1020   RBC 4.92 07/19/2007 1020   HGB 14.8 11/29/2012 0723   HCT 42.8 07/19/2007 1020   PLT 294 07/19/2007 1020   MCV 87.0 07/19/2007 1020   MCHC 34.7 07/19/2007 1020   RDW 13.5 07/19/2007 1020    Hgb A1C No results found for: HGBA1C   Assessment and Plan:  Preventative Health Maintenance:  Flu and tetanus UTD PSA done with labs today Colon screening UTD, will request records. Encouraged him to consume a balanced diet and exercise regimen Advised him to see an eye doctor and dentist annually Will check CBC, CMET, Lipid  profile today  Right Ear Cerumen Impaction:  Manual lavage by CMA Advised him to try Debrox OTC 2 x week to prevent buildup  RTC in 1 year, sooner if needed Webb Silversmith, NP

## 2017-10-28 NOTE — Assessment & Plan Note (Signed)
Continue Dexilant for now Will monitor

## 2017-10-28 NOTE — Assessment & Plan Note (Signed)
Will check TSH and Free T4 today Continue Synthroid for now Will adjust and refill Synthroid as needed based on labs

## 2017-10-28 NOTE — Assessment & Plan Note (Signed)
Continue Zyrtec prn.

## 2017-10-28 NOTE — Patient Instructions (Signed)

## 2017-10-29 LAB — HEPATITIS C ANTIBODY
HEP C AB: NONREACTIVE
SIGNAL TO CUT-OFF: 0.01 (ref <1.00)

## 2017-10-29 LAB — HIV ANTIBODY (ROUTINE TESTING W REFLEX): HIV 1&2 Ab, 4th Generation: NONREACTIVE

## 2017-11-15 DIAGNOSIS — H5203 Hypermetropia, bilateral: Secondary | ICD-10-CM | POA: Diagnosis not present

## 2017-11-15 DIAGNOSIS — H524 Presbyopia: Secondary | ICD-10-CM | POA: Diagnosis not present

## 2017-11-22 ENCOUNTER — Other Ambulatory Visit: Payer: Self-pay | Admitting: Sports Medicine

## 2017-11-22 DIAGNOSIS — M25511 Pain in right shoulder: Secondary | ICD-10-CM | POA: Diagnosis not present

## 2017-11-22 DIAGNOSIS — M5030 Other cervical disc degeneration, unspecified cervical region: Secondary | ICD-10-CM | POA: Diagnosis not present

## 2017-11-22 DIAGNOSIS — M5412 Radiculopathy, cervical region: Secondary | ICD-10-CM

## 2017-11-27 ENCOUNTER — Ambulatory Visit
Admission: RE | Admit: 2017-11-27 | Discharge: 2017-11-27 | Disposition: A | Discharge status: Home / Self Care | Payer: 59 | Source: Ambulatory Visit | Attending: Sports Medicine | Admitting: Sports Medicine

## 2017-11-27 DIAGNOSIS — M4802 Spinal stenosis, cervical region: Secondary | ICD-10-CM | POA: Diagnosis not present

## 2017-11-27 DIAGNOSIS — M5412 Radiculopathy, cervical region: Secondary | ICD-10-CM

## 2017-11-29 DIAGNOSIS — M542 Cervicalgia: Secondary | ICD-10-CM | POA: Diagnosis not present

## 2017-12-14 DIAGNOSIS — M5412 Radiculopathy, cervical region: Secondary | ICD-10-CM | POA: Diagnosis not present

## 2017-12-15 DIAGNOSIS — M5412 Radiculopathy, cervical region: Secondary | ICD-10-CM | POA: Diagnosis not present

## 2017-12-15 DIAGNOSIS — M5030 Other cervical disc degeneration, unspecified cervical region: Secondary | ICD-10-CM | POA: Diagnosis not present

## 2017-12-15 DIAGNOSIS — M542 Cervicalgia: Secondary | ICD-10-CM | POA: Diagnosis not present

## 2017-12-27 DIAGNOSIS — M5412 Radiculopathy, cervical region: Secondary | ICD-10-CM | POA: Insufficient documentation

## 2017-12-27 DIAGNOSIS — Z85850 Personal history of malignant neoplasm of thyroid: Secondary | ICD-10-CM | POA: Diagnosis not present

## 2017-12-31 DIAGNOSIS — M542 Cervicalgia: Secondary | ICD-10-CM | POA: Diagnosis not present

## 2017-12-31 DIAGNOSIS — M5412 Radiculopathy, cervical region: Secondary | ICD-10-CM | POA: Diagnosis not present

## 2017-12-31 DIAGNOSIS — M5030 Other cervical disc degeneration, unspecified cervical region: Secondary | ICD-10-CM | POA: Diagnosis not present

## 2018-01-24 DIAGNOSIS — M5412 Radiculopathy, cervical region: Secondary | ICD-10-CM | POA: Diagnosis not present

## 2018-02-04 DIAGNOSIS — M5030 Other cervical disc degeneration, unspecified cervical region: Secondary | ICD-10-CM | POA: Diagnosis not present

## 2018-02-04 DIAGNOSIS — M5412 Radiculopathy, cervical region: Secondary | ICD-10-CM | POA: Diagnosis not present

## 2018-02-04 DIAGNOSIS — M542 Cervicalgia: Secondary | ICD-10-CM | POA: Diagnosis not present

## 2018-02-07 DIAGNOSIS — E890 Postprocedural hypothyroidism: Secondary | ICD-10-CM | POA: Diagnosis not present

## 2018-02-07 DIAGNOSIS — C73 Malignant neoplasm of thyroid gland: Secondary | ICD-10-CM | POA: Diagnosis not present

## 2018-02-14 DIAGNOSIS — C73 Malignant neoplasm of thyroid gland: Secondary | ICD-10-CM | POA: Diagnosis not present

## 2018-02-14 DIAGNOSIS — E890 Postprocedural hypothyroidism: Secondary | ICD-10-CM | POA: Diagnosis not present

## 2018-02-17 ENCOUNTER — Telehealth: Payer: Self-pay | Admitting: Internal Medicine

## 2018-02-17 NOTE — Telephone Encounter (Signed)
Copied from [place]. Topic: Quick Communication - See Telephone Encounter >> Feb 17, 2018 10:27 AM Antonieta Iba C wrote:    CRM for notification. See Telephone encounter for: pt called in to request a Z-Pak, pt says that he is having the follow symptoms: body aches, chest congestion, cough   [facility] [address] [phone]   02/17/18.

## 2018-02-17 NOTE — Telephone Encounter (Signed)
Left message on voicemail.

## 2018-02-17 NOTE — Telephone Encounter (Signed)
He has to be seen. This could be the flu which would not require abx

## 2018-02-17 NOTE — Telephone Encounter (Signed)
I spoke with pt and last night he thought had fever; but did not take temp. Now temp is 100 ; S/T,H/A,prod cough with brownish green. Pt does not want to get out in the rain if zpak can be called in. Advised needs to be evaluated to determine what is causing symptoms; pt said he would make appt but prefers note to be sent to Avie Echevaria NP first. [facility] [address].

## 2018-02-18 ENCOUNTER — Ambulatory Visit: Payer: 59 | Admitting: Family Medicine

## 2018-02-18 ENCOUNTER — Encounter: Payer: Self-pay | Admitting: Family Medicine

## 2018-02-18 VITALS — BP 120/72 | HR 107 | Temp 100.2°F | Wt 205.5 lb

## 2018-02-18 DIAGNOSIS — J101 Influenza due to other identified influenza virus with other respiratory manifestations: Secondary | ICD-10-CM | POA: Insufficient documentation

## 2018-02-18 DIAGNOSIS — R509 Fever, unspecified: Secondary | ICD-10-CM | POA: Diagnosis not present

## 2018-02-18 LAB — POC INFLUENZA A&B (BINAX/QUICKVUE)
INFLUENZA B, POC: NEGATIVE
Influenza A, POC: POSITIVE — AB

## 2018-02-18 MED ORDER — ONDANSETRON HCL 4 MG PO TABS: 4.0000 mg | ORAL_TABLET | Freq: Three times a day (TID) | ORAL | 0 refills | Status: DC | PRN

## 2018-02-18 MED ORDER — GUAIFENESIN-CODEINE 100-10 MG/5ML PO SYRP: 5.0000 mL | ORAL_SOLUTION | Freq: Two times a day (BID) | ORAL | 0 refills | Status: DC | PRN

## 2018-02-18 NOTE — Patient Instructions (Signed)
You have the influenza A.  Push fluids and rest. zofran as needed for nausea  Codeine cough syrup for cough at night time.  Take ibuprofen 400-600mg  with meals for next few days for body aches and sore throat.  Let us know if fever >101, worsening productive cough or not improving as expected.  Influenza, Adult Influenza, more commonly known as "the flu," is a viral infection that primarily affects the respiratory tract. The respiratory tract includes organs that help you breathe, such as the lungs, nose, and throat. The flu causes many common cold symptoms, as well as a high fever and body aches. The flu spreads easily from person to person (is contagious). Getting a flu shot (influenza vaccination) every year is the best way to prevent influenza. What are the causes? Influenza is caused by a virus. You can catch the virus by:  Breathing in droplets from an infected person's cough or sneeze.  Touching something that was recently contaminated with the virus and then touching your mouth, nose, or eyes.  What increases the risk? The following factors may make you more likely to get the flu:  Not cleaning your hands frequently with soap and water or alcohol-based hand sanitizer.  Having close contact with many people during cold and flu season.  Touching your mouth, eyes, or nose without washing or sanitizing your hands first.  Not drinking enough fluids or not eating a healthy diet.  Not getting enough sleep or exercise.  Being under a high amount of stress.  Not getting a yearly (annual) flu shot.  You may be at a higher risk of complications from the flu, such as a severe lung infection (pneumonia), if you:  Are over the age of 5.  Are pregnant.  Have a weakened disease-fighting system (immune system). You may have a weakened immune system if you: ? Have HIV or AIDS. ? Are undergoing chemotherapy. ? Aretaking medicines that reduce the activity of (suppress) the immune  system.  Have a long-term (chronic) illness, such as heart disease, kidney disease, diabetes, or lung disease.  Have a liver disorder.  Are obese.  Have anemia.  What are the signs or symptoms? Symptoms of this condition typically last 4-10 days and may include:  Fever.  Chills.  Headache, body aches, or muscle aches.  Sore throat.  Cough.  Runny or congested nose.  Chest discomfort and cough.  Poor appetite.  Weakness or tiredness (fatigue).  Dizziness.  Nausea or vomiting.  How is this diagnosed? This condition may be diagnosed based on your medical history and a physical exam. Your health care provider may do a nose or throat swab test to confirm the diagnosis. How is this treated? If influenza is detected early, you can be treated with antiviral medicine that can reduce the length of your illness and the severity of your symptoms. This medicine may be given by mouth (orally) or through an IV tube that is inserted in one of your veins. The goal of treatment is to relieve symptoms by taking care of yourself at home. This may include taking over-the-counter medicines, drinking plenty of fluids, and adding humidity to the air in your home. In some cases, influenza goes away on its own. Severe influenza or complications from influenza may be treated in a hospital. Follow these instructions at home:  Take over-the-counter and prescription medicines only as told by your health care provider.  Use a cool mist humidifier to add humidity to the air in your home. This  can make breathing easier.  Rest as needed.  Drink enough fluid to keep your urine clear or pale yellow.  Cover your mouth and nose when you cough or sneeze.  Wash your hands with soap and water often, especially after you cough or sneeze. If soap and water are not available, use hand sanitizer.  Stay home from work or school as told by your health care provider. Unless you are visiting your health care  provider, try to avoid leaving home until your fever has been gone for 24 hours without the use of medicine.  Keep all follow-up visits as told by your health care provider. This is important. How is this prevented?  Getting an annual flu shot is the best way to avoid getting the flu. You may get the flu shot in late summer, fall, or winter. Ask your health care provider when you should get your flu shot.  Wash your hands often or use hand sanitizer often.  Avoid contact with people who are sick during cold and flu season.  Eat a healthy diet, drink plenty of fluids, get enough sleep, and exercise regularly. Contact a health care provider if:  You develop new symptoms.  You have: ? Chest pain. ? Diarrhea. ? A fever.  Your cough gets worse.  You produce more mucus.  You feel nauseous or you vomit. Get help right away if:  You develop shortness of breath or difficulty breathing.  Your skin or nails turn a bluish color.  You have severe pain or stiffness in your neck.  You develop a sudden headache or sudden pain in your face or ear.  You cannot stop vomiting. This information is not intended to replace advice given to you by your health care provider. Make sure you discuss any questions you have with your health care provider. Document Released: 12/11/2000 Document Revised: 05/21/2016 Document Reviewed: 10/08/2015 Elsevier Interactive Patient Education  2017 [employer].

## 2018-02-18 NOTE — Progress Notes (Signed)
BP 120/72 (BP Location: Left Arm, Patient Position: Sitting, Cuff Size: Normal)   Pulse (!) 107   Temp 100.2 F (37.9 C) (Oral)   Wt 205 lb 8 oz (93.2 kg)   SpO2 95%   BMI 30.79 kg/m    CC: flu like symptoms Subjective:    Patient ID: Matthew Newton, male    DOB: 1961-05-29, 57 y.o.   MRN: [id number]  HPI: Kassius Battiste is a 57 y.o. male presenting on 02/18/2018 for Generalized Body Aches (Body aches, HA, vomiting, nausea and fever. Sxs started 3 days ago. Tried Tylenol and TheraFlu.)   3d h/o body aches, headache, chest congestion, productive cough, has vomited x3 in last 24 hours. Fevers to 101.   Tried theraflu - made him nauseated and could not tolerated.  Has also tried tylenol, robitussin. Cough waking him up.   No sick contacts at home. No smokers at home.  No h/o asthma.   Relevant past medical, surgical, family and social history reviewed and updated as indicated. Interim medical history since our last visit reviewed. Allergies and medications reviewed and updated. Outpatient Medications Prior to Visit  Medication Sig Dispense Refill  . CALCIUM PO Take 1,000 mg by mouth daily.    . cetirizine (ZYRTEC) 10 MG tablet Take 10 mg by mouth daily. Takes as needed    . dexlansoprazole (DEXILANT) 60 MG capsule Take 60 mg by mouth daily. Takes as needed    . levothyroxine (SYNTHROID, LEVOTHROID) 100 MCG tablet Take 100 mcg by mouth daily.  10  . naproxen sodium (ALEVE) 220 MG tablet Take 220 mg by mouth daily as needed.    . multivitamin-iron-minerals-folic acid (CENTRUM) chewable tablet Chew 1 tablet by mouth daily.       No facility-administered medications prior to visit.      Per HPI unless specifically indicated in ROS section below Review of Systems     Objective:    BP 120/72 (BP Location: Left Arm, Patient Position: Sitting, Cuff Size: Normal)   Pulse (!) 107   Temp 100.2 F (37.9 C) (Oral)   Wt 205 lb 8 oz (93.2 kg)   SpO2 95%   BMI 30.79 kg/m   Wt  Readings from Last 3 Encounters:  02/18/18 205 lb 8 oz (93.2 kg)  10/28/17 202 lb (91.6 kg)  11/29/12 185 lb (83.9 kg)    Physical Exam  Constitutional: He appears well-developed and well-nourished. No distress.  Fatigued appearing  HENT:  Head: Normocephalic and atraumatic.  Right Ear: Hearing, tympanic membrane, external ear and ear canal normal.  Left Ear: Hearing, tympanic membrane, external ear and ear canal normal.  Nose: Mucosal edema present. No rhinorrhea.  Mouth/Throat: Uvula is midline and mucous membranes are normal. Posterior oropharyngeal erythema present. No oropharyngeal exudate, posterior oropharyngeal edema or tonsillar abscesses.  Eyes: Conjunctivae and EOM are normal. Pupils are equal, round, and reactive to light. No scleral icterus.  Neck: Normal range of motion. Neck supple.  Cardiovascular: Normal rate, regular rhythm, normal heart sounds and intact distal pulses.  No murmur heard. Pulmonary/Chest: Effort normal and breath sounds normal. No respiratory distress. He has no wheezes. He has no rales.  Lymphadenopathy:    He has no cervical adenopathy.  Skin: Skin is warm and dry. No rash noted.  Nursing note and vitals reviewed.     Assessment & Plan:   Problem List Items Addressed This Visit    Influenza A - Primary    Flu swab positive Supportive care reviewed -  rec NSAID, cheratussin codeine cough syrup, will provide zofran PRN nausea.  Will defer tamiflu at this time - likely outside of window.  Update if not improving with treatment as expected. Pt agrees with plan. Discussed contagious nature of illness.        Other Visit Diagnoses    Fever, unspecified fever cause       Relevant Orders   POC Influenza A&B(BINAX/QUICKVUE) (Completed)       Meds ordered this encounter  Medications  . ondansetron (ZOFRAN) 4 MG tablet    Sig: Take 1 tablet (4 mg total) by mouth every 8 (eight) hours as needed for nausea or vomiting.    Dispense:  20 tablet     Refill:  0  . guaiFENesin-codeine (CHERATUSSIN AC) 100-10 MG/5ML syrup    Sig: Take 5 mLs by mouth 2 (two) times daily as needed.    Dispense:  140 mL    Refill:  0   Orders Placed This Encounter  Procedures  . POC Influenza A&B(BINAX/QUICKVUE)    Follow up plan: Return if symptoms worsen or fail to improve.  Ria Bush, MD

## 2018-02-18 NOTE — Assessment & Plan Note (Signed)
Flu swab positive Supportive care reviewed - rec NSAID, cheratussin codeine cough syrup, will provide zofran PRN nausea.  Will defer tamiflu at this time - likely outside of window.  Update if not improving with treatment as expected. Pt agrees with plan. Discussed contagious nature of illness.

## 2018-02-22 NOTE — Telephone Encounter (Signed)
Pt was seen

## 2018-03-28 DIAGNOSIS — E890 Postprocedural hypothyroidism: Secondary | ICD-10-CM | POA: Diagnosis not present

## 2018-04-11 DIAGNOSIS — M25511 Pain in right shoulder: Secondary | ICD-10-CM | POA: Diagnosis not present

## 2018-04-18 ENCOUNTER — Other Ambulatory Visit: Payer: Self-pay | Admitting: Orthopedic Surgery

## 2018-04-18 DIAGNOSIS — M25511 Pain in right shoulder: Secondary | ICD-10-CM

## 2018-04-22 ENCOUNTER — Ambulatory Visit
Admission: RE | Admit: 2018-04-22 | Discharge: 2018-04-22 | Disposition: A | Discharge status: Home / Self Care | Payer: 59 | Source: Ambulatory Visit | Attending: Orthopedic Surgery | Admitting: Orthopedic Surgery

## 2018-04-22 DIAGNOSIS — M25511 Pain in right shoulder: Secondary | ICD-10-CM

## 2018-04-23 ENCOUNTER — Other Ambulatory Visit: Payer: 59

## 2018-04-25 DIAGNOSIS — M25511 Pain in right shoulder: Secondary | ICD-10-CM | POA: Diagnosis not present

## 2018-04-27 DIAGNOSIS — M25511 Pain in right shoulder: Secondary | ICD-10-CM | POA: Diagnosis not present

## 2018-04-27 DIAGNOSIS — M75111 Incomplete rotator cuff tear or rupture of right shoulder, not specified as traumatic: Secondary | ICD-10-CM | POA: Diagnosis not present

## 2018-05-28 HISTORY — PX: SHOULDER ARTHROSCOPY: SHX128

## 2018-06-02 DIAGNOSIS — M25511 Pain in right shoulder: Secondary | ICD-10-CM | POA: Diagnosis not present

## 2018-06-10 DIAGNOSIS — M5412 Radiculopathy, cervical region: Secondary | ICD-10-CM | POA: Diagnosis not present

## 2018-06-10 DIAGNOSIS — M5030 Other cervical disc degeneration, unspecified cervical region: Secondary | ICD-10-CM | POA: Diagnosis not present

## 2018-06-14 DIAGNOSIS — D225 Melanocytic nevi of trunk: Secondary | ICD-10-CM | POA: Diagnosis not present

## 2018-06-14 DIAGNOSIS — L821 Other seborrheic keratosis: Secondary | ICD-10-CM | POA: Diagnosis not present

## 2018-06-14 DIAGNOSIS — D1801 Hemangioma of skin and subcutaneous tissue: Secondary | ICD-10-CM | POA: Diagnosis not present

## 2018-06-15 DIAGNOSIS — M24111 Other articular cartilage disorders, right shoulder: Secondary | ICD-10-CM | POA: Diagnosis not present

## 2018-06-15 DIAGNOSIS — M7521 Bicipital tendinitis, right shoulder: Secondary | ICD-10-CM | POA: Diagnosis not present

## 2018-06-15 DIAGNOSIS — G8918 Other acute postprocedural pain: Secondary | ICD-10-CM | POA: Diagnosis not present

## 2018-06-15 DIAGNOSIS — M7531 Calcific tendinitis of right shoulder: Secondary | ICD-10-CM | POA: Diagnosis not present

## 2018-06-15 DIAGNOSIS — M7541 Impingement syndrome of right shoulder: Secondary | ICD-10-CM | POA: Diagnosis not present

## 2018-06-20 DIAGNOSIS — M75111 Incomplete rotator cuff tear or rupture of right shoulder, not specified as traumatic: Secondary | ICD-10-CM | POA: Diagnosis not present

## 2018-06-20 DIAGNOSIS — M25511 Pain in right shoulder: Secondary | ICD-10-CM | POA: Diagnosis not present

## 2018-06-24 DIAGNOSIS — M75111 Incomplete rotator cuff tear or rupture of right shoulder, not specified as traumatic: Secondary | ICD-10-CM | POA: Diagnosis not present

## 2018-06-24 DIAGNOSIS — M25511 Pain in right shoulder: Secondary | ICD-10-CM | POA: Diagnosis not present

## 2018-06-27 DIAGNOSIS — M75111 Incomplete rotator cuff tear or rupture of right shoulder, not specified as traumatic: Secondary | ICD-10-CM | POA: Diagnosis not present

## 2018-06-27 DIAGNOSIS — M25511 Pain in right shoulder: Secondary | ICD-10-CM | POA: Diagnosis not present

## 2018-06-29 ENCOUNTER — Telehealth: Payer: Self-pay | Admitting: Internal Medicine

## 2018-06-29 DIAGNOSIS — M25511 Pain in right shoulder: Secondary | ICD-10-CM | POA: Diagnosis not present

## 2018-06-29 DIAGNOSIS — M75111 Incomplete rotator cuff tear or rupture of right shoulder, not specified as traumatic: Secondary | ICD-10-CM | POA: Diagnosis not present

## 2018-06-29 MED ORDER — DEXLANSOPRAZOLE 60 MG PO CPDR: 60.0000 mg | DELAYED_RELEASE_CAPSULE | Freq: Every day | ORAL | 0 refills | Status: DC

## 2018-06-29 NOTE — Telephone Encounter (Signed)
Copied from [place] [id number]. Topic: Quick Communication - See Telephone Encounter >> Jun 29, 2018  9:47 AM Mylinda Latina, NT wrote: CRM for notification. See Telephone encounter for: 06/29/18. Patient called and states that he needs a refill of his dexlansoprazole (DEXILANT) 60 MG capsule .   [id number] [address], [place] [phone] (Phone) [phone] (Fax)

## 2018-06-29 NOTE — Telephone Encounter (Signed)
Dexilant 60 MG refill Last Refill: by Hisotical provider,10/28/17 medication addressed by PCP to continue. Last OV: 02/18/18 PCP: Webb Silversmith, FNP [facility] [address]

## 2018-07-04 DIAGNOSIS — M25511 Pain in right shoulder: Secondary | ICD-10-CM | POA: Diagnosis not present

## 2018-07-04 DIAGNOSIS — M75111 Incomplete rotator cuff tear or rupture of right shoulder, not specified as traumatic: Secondary | ICD-10-CM | POA: Diagnosis not present

## 2018-07-06 DIAGNOSIS — M25511 Pain in right shoulder: Secondary | ICD-10-CM | POA: Diagnosis not present

## 2018-07-06 DIAGNOSIS — M75111 Incomplete rotator cuff tear or rupture of right shoulder, not specified as traumatic: Secondary | ICD-10-CM | POA: Diagnosis not present

## 2018-07-11 DIAGNOSIS — M25511 Pain in right shoulder: Secondary | ICD-10-CM | POA: Diagnosis not present

## 2018-07-11 DIAGNOSIS — M75111 Incomplete rotator cuff tear or rupture of right shoulder, not specified as traumatic: Secondary | ICD-10-CM | POA: Diagnosis not present

## 2018-07-13 DIAGNOSIS — M75111 Incomplete rotator cuff tear or rupture of right shoulder, not specified as traumatic: Secondary | ICD-10-CM | POA: Diagnosis not present

## 2018-07-13 DIAGNOSIS — M25511 Pain in right shoulder: Secondary | ICD-10-CM | POA: Diagnosis not present

## 2018-07-18 DIAGNOSIS — M25511 Pain in right shoulder: Secondary | ICD-10-CM | POA: Diagnosis not present

## 2018-07-18 DIAGNOSIS — M75111 Incomplete rotator cuff tear or rupture of right shoulder, not specified as traumatic: Secondary | ICD-10-CM | POA: Diagnosis not present

## 2018-07-26 ENCOUNTER — Other Ambulatory Visit: Payer: Self-pay | Admitting: Internal Medicine

## 2018-09-27 ENCOUNTER — Ambulatory Visit: Payer: 59 | Admitting: Internal Medicine

## 2018-09-27 ENCOUNTER — Encounter: Payer: Self-pay | Admitting: Internal Medicine

## 2018-09-27 VITALS — BP 124/78 | HR 59 | Temp 98.0°F | Wt 200.0 lb

## 2018-09-27 DIAGNOSIS — D367 Benign neoplasm of other specified sites: Secondary | ICD-10-CM

## 2018-09-27 NOTE — Progress Notes (Signed)
Subjective:    Patient ID: Matthew Newton, male    DOB: August 05, 1961, 57 y.o.   MRN: [id number]  HPI  Pt presents to the clinic today with c/o a lump on his left thigh. He noticed this 8 days ago. The lump is not painful. It is not red, swollen or warm to touch. He has not noticed any drainage from the area. He denies any injury to the area. He has not tried anything OTC for this.  Review of Systems      Past Medical History:  Diagnosis Date  . Cancer [facility]) 2008   thyroid  . GERD (gastroesophageal reflux disease)   . PONV (postoperative nausea and vomiting)     Current Outpatient Medications  Medication Sig Dispense Refill  . CALCIUM PO Take 1,000 mg by mouth daily.    . cetirizine (ZYRTEC) 10 MG tablet Take 10 mg by mouth daily. Takes as needed    . DEXILANT 60 MG capsule TAKE 1 CAPSULE (60 MG TOTAL) BY MOUTH DAILY. TAKES AS NEEDED 30 capsule 3  . guaiFENesin-codeine (CHERATUSSIN AC) 100-10 MG/5ML syrup Take 5 mLs by mouth 2 (two) times daily as needed. 140 mL 0  . levothyroxine (SYNTHROID, LEVOTHROID) 100 MCG tablet Take 100 mcg by mouth daily.  10  . naproxen sodium (ALEVE) 220 MG tablet Take 220 mg by mouth daily as needed.    . ondansetron (ZOFRAN) 4 MG tablet Take 1 tablet (4 mg total) by mouth every 8 (eight) hours as needed for nausea or vomiting. 20 tablet 0   No current facility-administered medications for this visit.     Allergies  Allergen Reactions  . Contrast Media [Iodinated Diagnostic Agents] Shortness Of Breath    Pt states it "closed his chest up" and he "couldn't breathe".  . Metrizamide Shortness Of Breath    Pt states it "closed his chest up" and he "couldn't breathe".    Family History  Problem Relation Age of Onset  . Cancer Mother        stomach  . Heart disease Father   . Diabetes Neg Hx   . Stroke Neg Hx     Social History   Socioeconomic History  . Marital status: Married    Spouse name: Not on file  . Number of children: Not on file    . Years of education: Not on file  . Highest education level: Not on file  Occupational History  . Not on file  Social Needs  . Financial resource strain: Not on file  . Food insecurity:    Worry: Not on file    Inability: Not on file  . Transportation needs:    Medical: Not on file    Non-medical: Not on file  Tobacco Use  . Smoking status: Never Smoker  . Smokeless tobacco: Never Used  Substance and Sexual Activity  . Alcohol use: No  . Drug use: No  . Sexual activity: Yes  Lifestyle  . Physical activity:    Days per week: Not on file    Minutes per session: Not on file  . Stress: Not on file  Relationships  . Social connections:    Talks on phone: Not on file    Gets together: Not on file    Attends religious service: Not on file    Active member of club or organization: Not on file    Attends meetings of clubs or organizations: Not on file    Relationship status: Not on file  .  Intimate partner violence:    Fear of current or ex partner: Not on file    Emotionally abused: Not on file    Physically abused: Not on file    Forced sexual activity: Not on file  Other Topics Concern  . Not on file  Social History Narrative  . Not on file     Constitutional: Denies fever, malaise, fatigue, headache or abrupt weight changes.   Skin: Pt reports lump of left thigh. Denies redness, rashes, lesions or ulcercations.    No other specific complaints in a complete review of systems (except as listed in HPI above).  Objective:   Physical Exam  BP 124/78   Pulse (!) 59   Temp 98 F (36.7 C) (Oral)   Wt 200 lb (90.7 kg)   SpO2 98%   BMI 29.97 kg/m  Wt Readings from Last 3 Encounters:  09/27/18 200 lb (90.7 kg)  02/18/18 205 lb 8 oz (93.2 kg)  10/28/17 202 lb (91.6 kg)    General: Appears his stated age, well developed, well nourished in NAD. Skin: Warm, dry and intact. 1 cm round, smooth subcutaneous mass (likely cyst) noted of left medial thigh.   BMET     Component Value Date/Time   NA 141 10/28/2017 1030   K 4.9 10/28/2017 1030   CL 104 10/28/2017 1030   CO2 32 10/28/2017 1030   GLUCOSE 91 10/28/2017 1030   BUN 12 10/28/2017 1030   CREATININE 1.26 10/28/2017 1030   CALCIUM 9.4 10/28/2017 1030   GFRNONAA >60 07/19/2007 1020   GFRAA  07/19/2007 1020    >60        The eGFR has been calculated using the MDRD equation. This calculation has not been validated in all clinical    Lipid Panel     Component Value Date/Time   CHOL 195 10/28/2017 1030   TRIG 75.0 10/28/2017 1030   HDL 45.40 10/28/2017 1030   CHOLHDL 4 10/28/2017 1030   VLDL 15.0 10/28/2017 1030   LDLCALC 134 (H) 10/28/2017 1030    CBC    Component Value Date/Time   WBC 4.9 10/28/2017 1030   RBC 4.76 10/28/2017 1030   HGB 14.2 10/28/2017 1030   HCT 42.8 10/28/2017 1030   PLT 261.0 10/28/2017 1030   MCV 90.0 10/28/2017 1030   MCHC 33.0 10/28/2017 1030   RDW 14.2 10/28/2017 1030    Hgb A1C No results found for: HGBA1C          Assessment & Plan:   Subcutaneous Cyst of Left Thigh:  Benign Advised him no intervention necessary but if he wanted referral to general surgery for excision we could do that- he declines at this time Will monitor for now  RTC in 1 month for his annual exam Webb Silversmith, NP

## 2018-09-27 NOTE — Patient Instructions (Signed)
Epidermal Cyst An epidermal cyst is a small, painless lump under your skin. It may be called an epidermal inclusion cyst or an infundibular cyst. The cyst contains a grayish-white, bad-smelling substance (keratin). It is important not to pop epidermal cysts yourself. These cysts are usually harmless (benign), but they can get infected. Symptoms of infection may include:  Redness.  Inflammation.  Tenderness.  Warmth.  Fever.  A grayish-white, bad-smelling substance draining from the cyst.  Pus draining from the cyst.  Follow these instructions at home:  Take over-the-counter and prescription medicines only as told by your doctor.  If you were prescribed an antibiotic, use it as told by your doctor. Do not stop using the antibiotic even if you start to feel better.  Keep the area around your cyst clean and dry.  Wear loose, dry clothing.  Do not try to pop your cyst.  Avoid touching your cyst.  Check your cyst every day for signs of infection.  Keep all follow-up visits as told by your doctor. This is important. How is this prevented?  Wear clean, dry, clothing.  Avoid wearing tight clothing.  Keep your skin clean and dry. Shower or take baths every day.  Wash your body with a benzoyl peroxide wash when you shower or bathe. Contact a health care provider if:  Your cyst has symptoms of infection.  Your condition is not improving or is getting worse.  You have a cyst that looks different from other cysts you have had.  You have a fever. Get help right away if:  Redness spreads from the cyst into the surrounding area. This information is not intended to replace advice given to you by your health care provider. Make sure you discuss any questions you have with your health care provider. Document Released: 01/21/2005 Document Revised: 08/12/2016 Document Reviewed: 10/16/2015 Elsevier Interactive Patient Education  2018 [employer]  

## 2018-10-28 HISTORY — PX: CERVICAL DISCECTOMY: SHX98

## 2018-10-31 ENCOUNTER — Encounter: Payer: Self-pay | Admitting: Internal Medicine

## 2018-10-31 ENCOUNTER — Ambulatory Visit (INDEPENDENT_AMBULATORY_CARE_PROVIDER_SITE_OTHER): Payer: 59 | Admitting: Internal Medicine

## 2018-10-31 VITALS — BP 120/78 | HR 71 | Temp 97.9°F | Ht 68.75 in | Wt 200.0 lb

## 2018-10-31 DIAGNOSIS — E890 Postprocedural hypothyroidism: Secondary | ICD-10-CM

## 2018-10-31 DIAGNOSIS — Z0000 Encounter for general adult medical examination without abnormal findings: Secondary | ICD-10-CM

## 2018-10-31 DIAGNOSIS — M5020 Other cervical disc displacement, unspecified cervical region: Secondary | ICD-10-CM | POA: Insufficient documentation

## 2018-10-31 DIAGNOSIS — K219 Gastro-esophageal reflux disease without esophagitis: Secondary | ICD-10-CM | POA: Diagnosis not present

## 2018-10-31 DIAGNOSIS — Z23 Encounter for immunization: Secondary | ICD-10-CM | POA: Diagnosis not present

## 2018-10-31 DIAGNOSIS — Z125 Encounter for screening for malignant neoplasm of prostate: Secondary | ICD-10-CM | POA: Diagnosis not present

## 2018-10-31 LAB — CBC
HCT: 43.1 % (ref 39.0–52.0)
Hemoglobin: 14.6 g/dL (ref 13.0–17.0)
MCHC: 33.8 g/dL (ref 30.0–36.0)
MCV: 89 fl (ref 78.0–100.0)
Platelets: 257 10*3/uL (ref 150.0–400.0)
RBC: 4.84 Mil/uL (ref 4.22–5.81)
RDW: 13.9 % (ref 11.5–15.5)
WBC: 4.7 10*3/uL (ref 4.0–10.5)

## 2018-10-31 LAB — COMPREHENSIVE METABOLIC PANEL
ALT: 9 U/L (ref 0–53)
AST: 16 U/L (ref 0–37)
Albumin: 4.4 g/dL (ref 3.5–5.2)
Alkaline Phosphatase: 45 U/L (ref 39–117)
BUN: 12 mg/dL (ref 6–23)
CO2: 30 mEq/L (ref 19–32)
Calcium: 8.9 mg/dL (ref 8.4–10.5)
Chloride: 104 mEq/L (ref 96–112)
Creatinine, Ser: 1.47 mg/dL (ref 0.40–1.50)
GFR: 52.13 mL/min — ABNORMAL LOW (ref >60.00)
Glucose, Bld: 95 mg/dL (ref 70–99)
Potassium: 4.7 mEq/L (ref 3.5–5.1)
Sodium: 140 mEq/L (ref 135–145)
TOTAL PROTEIN: 6.9 g/dL (ref 6.0–8.3)
Total Bilirubin: 0.6 mg/dL (ref 0.2–1.2)

## 2018-10-31 LAB — LIPID PANEL
CHOLESTEROL: 185 mg/dL (ref 0–200)
HDL: 42.7 mg/dL (ref >39.00)
LDL Cholesterol: 131 mg/dL — ABNORMAL HIGH (ref 0–99)
NonHDL: 142.72
TRIGLYCERIDES: 58 mg/dL (ref 0.0–149.0)
Total CHOL/HDL Ratio: 4
VLDL: 11.6 mg/dL (ref 0.0–40.0)

## 2018-10-31 LAB — T4, FREE: Free T4: 1.1 ng/dL (ref 0.60–1.60)

## 2018-10-31 LAB — PSA: PSA: 0.59 ng/mL (ref 0.10–4.00)

## 2018-10-31 LAB — TSH: TSH: 1.83 u[IU]/mL (ref 0.35–4.50)

## 2018-10-31 NOTE — Patient Instructions (Signed)

## 2018-10-31 NOTE — Assessment & Plan Note (Signed)
Following with neurosurgery Continue Aleve and muscle relaxer as needed

## 2018-10-31 NOTE — Assessment & Plan Note (Signed)
TSH and Free T4 today Continue Levothyroxine, will adjust if needed based on labs 

## 2018-10-31 NOTE — Progress Notes (Signed)
Subjective:    Patient ID: Matthew Newton, male    DOB: 04/15/1961, 57 y.o.   MRN: [id number]  HPI  Pt presents to the clinic today for his annual exam. He is also due to follow up chronic conditions.  Hx of Thyroid Cancer: s/p thyroidectomy. He denies any issues on his current dose of Levothyroxine. He is due to have labs repeated today.  GERD: Triggered by spicy, tomato based foods, laying down after eating. He takes Dexilant as needed with good relief. There is no upper GI to review.  Ruptured Cervical Disc: C5. He is getting spinal injections with minimal relief. He takes Aleve and muscle relaxers as needed with some relief. He is considering surgical intervention. He follows with Dr. Ronnald Ramp.  Flu: 09/2017 Tetanus: 09/2017 PSA Screening: 10/2017 Colon Screening: 2015 Vision Screening: annually Dentist: biannually  Diet: He does eat meat. He consumes some fruits and veggies. He does eat fried foods. He drinks mostly water, diet soda, gatorade Exercise: None  Review of Systems      Past Medical History:  Diagnosis Date  . Cancer [facility]) 2008   thyroid  . GERD (gastroesophageal reflux disease)   . PONV (postoperative nausea and vomiting)     Current Outpatient Medications  Medication Sig Dispense Refill  . cetirizine (ZYRTEC) 10 MG tablet Take 10 mg by mouth daily. Takes as needed    . DEXILANT 60 MG capsule TAKE 1 CAPSULE (60 MG TOTAL) BY MOUTH DAILY. TAKES AS NEEDED 30 capsule 3  . levothyroxine (SYNTHROID, LEVOTHROID) 100 MCG tablet Take 100 mcg by mouth daily.  10  . naproxen sodium (ALEVE) 220 MG tablet Take 220 mg by mouth daily as needed.     No current facility-administered medications for this visit.     Allergies  Allergen Reactions  . Contrast Media [Iodinated Diagnostic Agents] Shortness Of Breath    Pt states it "closed his chest up" and he "couldn't breathe".  . Metrizamide Shortness Of Breath    Pt states it "closed his chest up" and he "couldn't  breathe".    Family History  Problem Relation Age of Onset  . Cancer Mother        stomach  . Heart disease Father   . Diabetes Neg Hx   . Stroke Neg Hx     Social History   Socioeconomic History  . Marital status: Married    Spouse name: Not on file  . Number of children: Not on file  . Years of education: Not on file  . Highest education level: Not on file  Occupational History  . Not on file  Social Needs  . Financial resource strain: Not on file  . Food insecurity:    Worry: Not on file    Inability: Not on file  . Transportation needs:    Medical: Not on file    Non-medical: Not on file  Tobacco Use  . Smoking status: Never Smoker  . Smokeless tobacco: Never Used  Substance and Sexual Activity  . Alcohol use: No  . Drug use: No  . Sexual activity: Yes  Lifestyle  . Physical activity:    Days per week: Not on file    Minutes per session: Not on file  . Stress: Not on file  Relationships  . Social connections:    Talks on phone: Not on file    Gets together: Not on file    Attends religious service: Not on file    Active member of  club or organization: Not on file    Attends meetings of clubs or organizations: Not on file    Relationship status: Not on file  . Intimate partner violence:    Fear of current or ex partner: Not on file    Emotionally abused: Not on file    Physically abused: Not on file    Forced sexual activity: Not on file  Other Topics Concern  . Not on file  Social History Narrative  . Not on file     Constitutional: Denies fever, malaise, fatigue, headache or abrupt weight changes.  HEENT: Pt reports white noise in ears (has had audiology eval). Denies eye pain, eye redness, ear pain, ringing in the ears, wax buildup, runny nose, nasal congestion, bloody nose, or sore throat. Respiratory: Denies difficulty breathing, shortness of breath, cough or sputum production.   Cardiovascular: Denies chest pain, chest tightness, palpitations  or swelling in the hands or feet.  Gastrointestinal: Pt reports intermittent reflux. Denies abdominal pain, bloating, constipation, diarrhea or blood in the stool.  GU: Denies urgency, frequency, pain with urination, burning sensation, blood in urine, odor or discharge. Musculoskeletal: Pt reports neck pain. Denies decrease in range of motion, difficulty with gait, or jointswelling.  Skin: Denies redness, rashes, lesions or ulcercations.  Neurological: Denies dizziness, difficulty with memory, difficulty with speech or problems with balance and coordination.  Psych: Denies anxiety, depression, SI/HI.  No other specific complaints in a complete review of systems (except as listed in HPI above).  Objective:   Physical Exam   BP 120/78   Pulse 71   Temp 97.9 F (36.6 C) (Oral)   Ht 5' 8.75" (1.746 m)   Wt 200 lb (90.7 kg)   SpO2 98%   BMI 29.75 kg/m  Wt Readings from Last 3 Encounters:  10/31/18 200 lb (90.7 kg)  09/27/18 200 lb (90.7 kg)  02/18/18 205 lb 8 oz (93.2 kg)    General: Appears his stated age, well developed, well nourished in NAD. Skin: Warm, dry and intact. No rashes, lesions or ulcerations noted. HEENT: Head: normal shape and size; Eyes: sclera white, no icterus, conjunctiva pink, PERRLA and EOMs intact; Ears: Tm's gray and intact, normal light reflex; Throat/Mouth: Teeth present, mucosa pink and moist, no exudate, lesions or ulcerations noted.  Neck:  Neck supple, trachea midline. No masses, lumps present.  Cardiovascular: Normal rate and rhythm. S1,S2 noted.  No murmur, rubs or gallops noted. No JVD or BLE edema. No carotid bruits noted. Pulmonary/Chest: Normal effort and positive vesicular breath sounds. No respiratory distress. No wheezes, rales or ronchi noted.  Abdomen: Soft and nontender. Normal bowel sounds. No distention or masses noted. Liver, spleen and kidneys non palpable. Musculoskeletal: Strength 5/5 BUE/BLE. No difficulty with gait.  Neurological:  Alert and oriented. Cranial nerves II-XII grossly intact. Coordination normal.  Psychiatric: Mood and affect normal. Behavior is normal. Judgment and thought content normal.     BMET    Component Value Date/Time   NA 141 10/28/2017 1030   K 4.9 10/28/2017 1030   CL 104 10/28/2017 1030   CO2 32 10/28/2017 1030   GLUCOSE 91 10/28/2017 1030   BUN 12 10/28/2017 1030   CREATININE 1.26 10/28/2017 1030   CALCIUM 9.4 10/28/2017 1030   GFRNONAA >60 07/19/2007 1020   GFRAA  07/19/2007 1020    >60        The eGFR has been calculated using the MDRD equation. This calculation has not been validated in all clinical  Lipid Panel     Component Value Date/Time   CHOL 195 10/28/2017 1030   TRIG 75.0 10/28/2017 1030   HDL 45.40 10/28/2017 1030   CHOLHDL 4 10/28/2017 1030   VLDL 15.0 10/28/2017 1030   LDLCALC 134 (H) 10/28/2017 1030    CBC    Component Value Date/Time   WBC 4.9 10/28/2017 1030   RBC 4.76 10/28/2017 1030   HGB 14.2 10/28/2017 1030   HCT 42.8 10/28/2017 1030   PLT 261.0 10/28/2017 1030   MCV 90.0 10/28/2017 1030   MCHC 33.0 10/28/2017 1030   RDW 14.2 10/28/2017 1030    Hgb A1C No results found for: HGBA1C         Assessment & Plan:   Preventative Health Maintenance:  Flu shot today Tetanus UTD PSA today with labs Colon screening UTD, will request records Encouraged him to consume a balanced diet and exercise regimen Advised him to see an eye doctor and dentist annually Will check CBC, CMET, Lipid, TSH, Free T4, and PSA today  RTC in 1 year, sooner if needed Webb Silversmith, NP

## 2018-10-31 NOTE — Assessment & Plan Note (Signed)
Intermittent He will continue Dexilant prn (although he reports this is expensive and when he runs out, we will consider and alternative) Avoid spicy, tomato based foods and laying down after eating

## 2018-11-07 ENCOUNTER — Telehealth: Payer: Self-pay | Admitting: Internal Medicine

## 2018-11-07 DIAGNOSIS — M5412 Radiculopathy, cervical region: Secondary | ICD-10-CM | POA: Diagnosis not present

## 2018-11-07 DIAGNOSIS — Z6830 Body mass index (BMI) 30.0-30.9, adult: Secondary | ICD-10-CM | POA: Diagnosis not present

## 2018-11-07 NOTE — Telephone Encounter (Signed)
Pt requested 11/4 labs faxed to Dr. Sherley Bounds office for surgical clearance. I faxed to [phone].

## 2018-11-21 DIAGNOSIS — M4722 Other spondylosis with radiculopathy, cervical region: Secondary | ICD-10-CM | POA: Diagnosis not present

## 2018-11-21 DIAGNOSIS — M5412 Radiculopathy, cervical region: Secondary | ICD-10-CM | POA: Diagnosis not present

## 2018-11-21 DIAGNOSIS — M50122 Cervical disc disorder at C5-C6 level with radiculopathy: Secondary | ICD-10-CM | POA: Diagnosis not present

## 2018-11-23 ENCOUNTER — Emergency Department (HOSPITAL_COMMUNITY)
Admission: EM | Admit: 2018-11-23 | Discharge: 2018-11-23 | Disposition: A | Discharge status: Home / Self Care | Payer: 59 | Attending: Emergency Medicine | Admitting: Emergency Medicine

## 2018-11-23 ENCOUNTER — Other Ambulatory Visit: Payer: Self-pay

## 2018-11-23 ENCOUNTER — Emergency Department (HOSPITAL_COMMUNITY): Payer: 59

## 2018-11-23 ENCOUNTER — Encounter (HOSPITAL_COMMUNITY): Payer: Self-pay | Admitting: Emergency Medicine

## 2018-11-23 DIAGNOSIS — M542 Cervicalgia: Secondary | ICD-10-CM | POA: Diagnosis not present

## 2018-11-23 DIAGNOSIS — J302 Other seasonal allergic rhinitis: Secondary | ICD-10-CM | POA: Diagnosis not present

## 2018-11-23 DIAGNOSIS — Z79899 Other long term (current) drug therapy: Secondary | ICD-10-CM | POA: Insufficient documentation

## 2018-11-23 DIAGNOSIS — E039 Hypothyroidism, unspecified: Secondary | ICD-10-CM | POA: Insufficient documentation

## 2018-11-23 DIAGNOSIS — R55 Syncope and collapse: Secondary | ICD-10-CM | POA: Insufficient documentation

## 2018-11-23 DIAGNOSIS — R0902 Hypoxemia: Secondary | ICD-10-CM | POA: Diagnosis not present

## 2018-11-23 DIAGNOSIS — R4020 Unspecified coma: Secondary | ICD-10-CM | POA: Diagnosis not present

## 2018-11-23 LAB — BASIC METABOLIC PANEL
Anion gap: 9 (ref 5–15)
BUN: 6 mg/dL (ref 6–20)
CO2: 27 mmol/L (ref 22–32)
CREATININE: 1.45 mg/dL — AB (ref 0.61–1.24)
Calcium: 7.9 mg/dL — ABNORMAL LOW (ref 8.9–10.3)
Chloride: 100 mmol/L (ref 98–111)
GFR calc non Af Amer: 53 mL/min — ABNORMAL LOW (ref >60)
Glucose, Bld: 116 mg/dL — ABNORMAL HIGH (ref 70–99)
Potassium: 3.4 mmol/L — ABNORMAL LOW (ref 3.5–5.1)
SODIUM: 136 mmol/L (ref 135–145)

## 2018-11-23 LAB — URINALYSIS, ROUTINE W REFLEX MICROSCOPIC
Bilirubin Urine: NEGATIVE
GLUCOSE, UA: NEGATIVE mg/dL
Hgb urine dipstick: NEGATIVE
Ketones, ur: 20 mg/dL — AB
Leukocytes, UA: NEGATIVE
Nitrite: NEGATIVE
PH: 7 (ref 5.0–8.0)
Protein, ur: 30 mg/dL — AB
SPECIFIC GRAVITY, URINE: 1.014 (ref 1.005–1.030)

## 2018-11-23 LAB — CBC WITH DIFFERENTIAL/PLATELET
Abs Immature Granulocytes: 0.08 10*3/uL — ABNORMAL HIGH (ref 0.00–0.07)
Basophils Absolute: 0 10*3/uL (ref 0.0–0.1)
Basophils Relative: 0 %
EOS PCT: 1 %
Eosinophils Absolute: 0.2 10*3/uL (ref 0.0–0.5)
HEMATOCRIT: 41.8 % (ref 39.0–52.0)
HEMOGLOBIN: 13.1 g/dL (ref 13.0–17.0)
Immature Granulocytes: 1 %
LYMPHS PCT: 10 %
Lymphs Abs: 1.3 10*3/uL (ref 0.7–4.0)
MCH: 28.8 pg (ref 26.0–34.0)
MCHC: 31.3 g/dL (ref 30.0–36.0)
MCV: 91.9 fL (ref 80.0–100.0)
MONO ABS: 0.9 10*3/uL (ref 0.1–1.0)
MONOS PCT: 7 %
Neutro Abs: 10.8 10*3/uL — ABNORMAL HIGH (ref 1.7–7.7)
Neutrophils Relative %: 81 %
Platelets: 212 10*3/uL (ref 150–400)
RBC: 4.55 MIL/uL (ref 4.22–5.81)
RDW: 12.8 % (ref 11.5–15.5)
WBC: 13.3 10*3/uL — AB (ref 4.0–10.5)
nRBC: 0 % (ref 0.0–0.2)

## 2018-11-23 LAB — CBG MONITORING, ED: Glucose-Capillary: 104 mg/dL — ABNORMAL HIGH (ref 70–99)

## 2018-11-23 LAB — TROPONIN I: Troponin I: <0.03 ng/mL (ref <0.03)

## 2018-11-23 MED ORDER — SODIUM CHLORIDE 0.9 % IV BOLUS
1000.0000 mL | Freq: Once | INTRAVENOUS | Status: AC
  Administered 2018-11-23: 1000 mL via INTRAVENOUS

## 2018-11-23 NOTE — ED Triage Notes (Addendum)
Per EMS pt is from home.  Pt come to [place] with a syncopal event.  Pt was at home and went to use the bathroom because he felt nauseous so he got on the floor to he could vomit in the toilet and passed out for 2-3 min, wife was at his side.  Pt woke up wife called EMS and they state intial BP was in the 68J systolic.  In route they gave 450 of NS and his last BP with them was 340G systolic. AOx4 NAD noted at this time. No history of syncope. Pt has recent history of neck surgery on Sunday (fusion) and took a Norco last night at 10pm and a muscle relaxer at 6am this morning.

## 2018-11-23 NOTE — ED Notes (Signed)
Pt in CT.

## 2018-11-23 NOTE — ED Notes (Signed)
Pt given urinal, pt knows we need urine for urinalysis.

## 2018-11-23 NOTE — Discharge Instructions (Addendum)
The testing today indicates that you had a fainting spell, associated with episode of nausea.  It is unclear why you are nauseated.  It is likely multifactorial.  Sure that you are getting plenty to drink and eat.  Try to avoid using the narcotic pain reliever, and muscle relaxer.  Follow-up with your primary care doctor, and neurosurgeon, as needed for problems.

## 2018-11-23 NOTE — ED Provider Notes (Signed)
[facility] Provider Note   CSN: [id number] Arrival date & time:        History   Chief Complaint Chief Complaint  Patient presents with  . Loss of Consciousness    HPI Matthew Newton is a 57 y.o. male.  HPI   Patient was at home this morning when he felt that his mouth was dry, this caused him to feel nauseated.  He went to the bathroom where he became weak, no down, and then while kneeling on the floor called to his wife.  She came to him and found him unconscious for about 2 minutes.  She called EMS who responded and found the patient hypotensive in the 80s.  He was treated with IV fluids, with improvement of his blood pressure.  The patient is recovering from neck surgery for fusion.  He has been using oral narcotics and skeletal muscle relaxants.  He feels like he is eating well.  He denies headache, fever, chills, cough, focal weakness or paresthesia.  No injuries associated with a syncopal episode.  No prior similar problems.  Patient denies chest pain, abdominal pain, back pain, or worsening neck pain.  There are no other known modifying factors.  Past Medical History:  Diagnosis Date  . Cancer [facility]) 2008   thyroid  . GERD (gastroesophageal reflux disease)   . PONV (postoperative nausea and vomiting)     Patient Active Problem List   Diagnosis Date Noted  . Ruptured cervical disc 10/31/2018  . Hypothyroidism 10/28/2017  . GERD (gastroesophageal reflux disease) 10/28/2017  . Seasonal allergies 10/28/2017    Past Surgical History:  Procedure Laterality Date  . CARPOMETACARPAL ([place]) FUSION OF THUMB  11/29/2012   Procedure: CARPOMETACARPAL ([place]) FUSION OF THUMB;  Surgeon: Cammie Sickle., MD;  Location: [facility];  Service: Orthopedics;  Laterality: Right;  Fusion Right Thumb IP Joint   . HERNIA REPAIR  1994   Right  . KNEE ARTHROSCOPY  1990 & 1991  . SHOULDER ARTHROSCOPY Right 05/2018  . THYROIDECTOMY  2008    . TONSILLECTOMY    . WRIST ARTHROSCOPY  01/07/2012   Procedure: ARTHROSCOPY WRIST;  Surgeon: Cammie Sickle., MD;  Location: [place];  Service: Orthopedics;  Laterality: Left;  left wrist arthroscopy with debridement triangular fibrocartlidge complex repair ulna shortening and ulna nonunion repair.        Home Medications    Prior to Admission medications   Medication Sig Start Date End Date Taking? Authorizing Provider  cetirizine (ZYRTEC) 10 MG tablet Take 10 mg by mouth daily. Takes as needed    [provider]  DEXILANT 60 MG capsule TAKE 1 CAPSULE (60 MG TOTAL) BY MOUTH DAILY. TAKES AS NEEDED 07/27/18   Jearld Fenton, NP  levothyroxine (SYNTHROID, LEVOTHROID) 100 MCG tablet Take 100 mcg by mouth daily. 10/05/17   [provider]  naproxen sodium (ALEVE) 220 MG tablet Take 220 mg by mouth daily as needed.    [provider]    Family History Family History  Problem Relation Age of Onset  . Cancer Mother        stomach  . Heart disease Father   . Diabetes Neg Hx   . Stroke Neg Hx     Social History Social History   Tobacco Use  . Smoking status: Never Smoker  . Smokeless tobacco: Never Used  Substance Use Topics  . Alcohol use: No  . Drug use:  No     Allergies   Contrast media [iodinated diagnostic agents] and Metrizamide   Review of Systems Review of Systems  All other systems reviewed and are negative.    Physical Exam Updated Vital Signs BP 129/87   Pulse 78   Temp 98.6 F (37 C) (Oral)   Resp 13   Ht 5\' 9"  (1.753 m)   Wt 88.5 kg   SpO2 100%   BMI 28.80 kg/m   Physical Exam  Constitutional: He is oriented to person, place, and time. He appears well-developed and well-nourished. No distress.  HENT:  Head: Normocephalic and atraumatic.  Right Ear: External ear normal.  Left Ear: External ear normal.  Eyes: Pupils are equal, round, and reactive to light. Conjunctivae and EOM are normal.  Neck:  Normal range of motion and phonation normal. Neck supple.  Cardiovascular: Normal rate, regular rhythm and normal heart sounds.  Pulmonary/Chest: Effort normal and breath sounds normal. He exhibits no bony tenderness.  Abdominal: Soft. There is no tenderness.  Musculoskeletal: Normal range of motion.  Healing wound right anterior lower neck with dressing applied to it.  No associated crepitation, swelling or drainage.  Neurological: He is alert and oriented to person, place, and time. No cranial nerve deficit or sensory deficit. He exhibits normal muscle tone. Coordination normal.  No dysarthria, aphasia or nystagmus.  Skin: Skin is warm, dry and intact.  Psychiatric: He has a normal mood and affect. His behavior is normal. Judgment and thought content normal.  Nursing note and vitals reviewed.    ED Treatments / Results  Labs (all labs ordered are listed, but only abnormal results are displayed) Labs Reviewed  BASIC METABOLIC PANEL - Abnormal; Notable for the following components:      Result Value   Potassium 3.4 (*)    Glucose, Bld 116 (*)    Creatinine, Ser 1.45 (*)    Calcium 7.9 (*)    GFR calc non Af Amer 53 (*)    All other components within normal limits  CBC WITH DIFFERENTIAL/PLATELET - Abnormal; Notable for the following components:   WBC 13.3 (*)    Neutro Abs 10.8 (*)    Abs Immature Granulocytes 0.08 (*)    All other components within normal limits  URINALYSIS, ROUTINE W REFLEX MICROSCOPIC - Abnormal; Notable for the following components:   APPearance HAZY (*)    Ketones, ur 20 (*)    Protein, ur 30 (*)    Bacteria, UA RARE (*)    All other components within normal limits  CBG MONITORING, ED - Abnormal; Notable for the following components:   Glucose-Capillary 104 (*)    All other components within normal limits  TROPONIN I    EKG EKG Interpretation  Date/Time:  Wednesday November 23 2018 09:55:07 EST Ventricular Rate:  77 PR Interval:    QRS  Duration: 155 QT Interval:  407 QTC Calculation: 461 R Axis:   25 Text Interpretation:  Sinus rhythm Right bundle branch block Probable lateral infarct, old No old tracing to compare Confirmed by Daleen Bo [phone]) on 11/23/2018 10:22:39 AM   Radiology Ct Head Wo Contrast  Result Date: 11/23/2018 CLINICAL DATA:  Syncope with neck region stiffness EXAM: CT HEAD WITHOUT CONTRAST CT CERVICAL SPINE WITHOUT CONTRAST TECHNIQUE: Multidetector CT imaging of the head and cervical spine was performed following the standard protocol without intravenous contrast. Multiplanar CT image reconstructions of the cervical spine were also generated. COMPARISON:  None. FINDINGS: CT HEAD FINDINGS Brain: The ventricles  are normal in size and configuration. There is no intracranial mass, hemorrhage, extra-axial fluid collection, or midline shift. Brain parenchyma appears unremarkable. No acute infarct evident. Vascular: No hyperdense vessels are evident. No appreciable vascular calcification. Skull: The bony calvarium appears intact. Sinuses/Orbits: There is opacification in several ethmoid air cells bilaterally. There is mucosal thickening in each maxillary antrum, more on the left than on the right. Orbits appear symmetric bilaterally. There is apparent nasal turbinate edema. There is leftward deviation of the nasal septum. Other: Mastoid air cells are clear. CT CERVICAL SPINE FINDINGS Alignment: There is no appreciable spondylolisthesis. Skull base and vertebrae: Patient is status post anterior screw and plate fixation from P5-T6 with support hardware intact. Disc spacers are noted at C5-6 and C6-7. No fracture is demonstrable. There are no blastic or lytic bone lesions. Soft tissues and spinal canal: Prevertebral soft tissues and predental space regions are normal. No cord canal hematoma evident. No paraspinous lesions. Disc levels: Disc spacers are noted at C5-6 and C6-7. Other disc spaces appear unremarkable. There is  facet hypertrophy at several levels. There is mild exit foraminal narrowing on the right at C3-4 without nerve root edema or effacement. Similar changes are noted at C4-5 on the left with mild impression on the exiting nerve root due to bony hypertrophy. At C5-6, there is severe exit foraminal narrowing on the right with impression on the exiting nerve root on the right at C5-6. There is moderate narrowing of the exit foramen on the left at C5-6. There is moderate narrowing of each exit foramen at C6-7 bilaterally. No frank disc extrusion or stenosis evident. Upper chest: Visualized upper lung zones are clear. Other: There is slight calcification in the left subclavian artery. Postoperative changes are noted in the thyroid region consistent with previous thyroidectomy. Slight calcification noted in each carotid artery. IMPRESSION: CT head: Areas of paranasal sinus disease. Nasal turbinate edema noted. Deviated nasal septum. Brain parenchyma appears normal.  No mass or hemorrhage. CT cervical spine: 1. No evident fracture or spondylolisthesis. 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 Postoperative change anteriorly at C5, C6, and C7 with support hardware intact. 2.  Status post thyroidectomy. 3. Foci of arterial vascular calcification noted in both carotid arteries and left subclavian artery. Electronically Signed   By: Lowella Grip III M.D.   On: 11/23/2018 10:58   Ct Cervical Spine Wo Contrast  Result Date: 11/23/2018 CLINICAL DATA:  Syncope with neck region stiffness EXAM: CT HEAD WITHOUT CONTRAST CT CERVICAL SPINE WITHOUT CONTRAST TECHNIQUE: Multidetector CT imaging of the head and cervical spine was performed following the standard protocol without intravenous contrast. Multiplanar CT image reconstructions of the cervical spine were also generated. COMPARISON:  None. FINDINGS: CT HEAD FINDINGS Brain: The ventricles are normal  in size and configuration. There is no intracranial mass, hemorrhage, extra-axial fluid collection, or midline shift. Brain parenchyma appears unremarkable. No acute infarct evident. Vascular: No hyperdense vessels are evident. No appreciable vascular calcification. Skull: The bony calvarium appears intact. Sinuses/Orbits: There is opacification in several ethmoid air cells bilaterally. There is mucosal thickening in each maxillary antrum, more on the left than on the right. Orbits appear symmetric bilaterally. There is apparent nasal turbinate edema. There is leftward deviation of the nasal septum. Other: Mastoid air cells are clear. CT CERVICAL SPINE FINDINGS Alignment: There is no appreciable spondylolisthesis. Skull base and vertebrae: Patient is status post anterior screw and plate fixation from R4-E3 with support  hardware intact. Disc spacers are noted at C5-6 and C6-7. No fracture is demonstrable. There are no blastic or lytic bone lesions. Soft tissues and spinal canal: Prevertebral soft tissues and predental space regions are normal. No cord canal hematoma evident. No paraspinous lesions. Disc levels: Disc spacers are noted at C5-6 and C6-7. Other disc spaces appear unremarkable. There is facet hypertrophy at several levels. There is mild exit foraminal narrowing on the right at C3-4 without nerve root edema or effacement. Similar changes are noted at C4-5 on the left with mild impression on the exiting nerve root due to bony hypertrophy. At C5-6, there is severe exit foraminal narrowing on the right with impression on the exiting nerve root on the right at C5-6. There is moderate narrowing of the exit foramen on the left at C5-6. There is moderate narrowing of each exit foramen at C6-7 bilaterally. No frank disc extrusion or stenosis evident. Upper chest: Visualized upper lung zones are clear. Other: There is slight calcification in the left subclavian artery. Postoperative changes are noted in the thyroid  region consistent with previous thyroidectomy. Slight calcification noted in each carotid artery. IMPRESSION: CT head: Areas of paranasal sinus disease. Nasal turbinate edema noted. Deviated nasal septum. Brain parenchyma appears normal.  No mass or hemorrhage. CT cervical spine: 1. No evident fracture or spondylolisthesis. 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 Postoperative change anteriorly at C5, C6, and C7 with support hardware intact. 2.  Status post thyroidectomy. 3. Foci of arterial vascular calcification noted in both carotid arteries and left subclavian artery. Electronically Signed   By: Lowella Grip III M.D.   On: 11/23/2018 10:58    Procedures .Critical Care Performed by: Daleen Bo, MD Authorized by: Daleen Bo, MD   Critical care provider statement:    Critical care time (minutes):  35   Critical care start time:  11/23/2018 10:00 AM   Critical care end time:  11/23/2018 1:15 PM   Critical care time was exclusive of:  Separately billable procedures and treating other patients   Critical care was time spent personally by me on the following activities:  Blood draw for specimens, development of treatment plan with patient or surrogate, discussions with consultants, evaluation of patient's response to treatment, examination of patient, obtaining history from patient or surrogate, ordering and performing treatments and interventions, ordering and review of laboratory studies, pulse oximetry, re-evaluation of patient's condition, review of old charts and ordering and review of radiographic studies   (including critical care time)  Medications Ordered in ED Medications  sodium chloride 0.9 % bolus 1,000 mL (0 mLs Intravenous Stopped 11/23/18 1130)     Initial Impression / Assessment and Plan / ED Course  I have reviewed the triage vital signs and the nursing notes.  Pertinent labs & imaging  results that were available during my care of the patient were reviewed by me and considered in my medical decision making (see chart for details).  Clinical Course as of Nov 23 1308  Wed Nov 23, 2018  1305 Urinalysis, Routine w reflex microscopic(!) [EW]  1305 Normal  Urinalysis, Routine w reflex microscopic(!) [EW]  1305 Normal  Troponin I - Once [EW]  1305 Normal except white count elevated 13.3  CBC with Differential(!) [EW]  1305 Normal except potassium low, glucose high, creatinine high, calcium low, GFR low  Basic metabolic panel(!) [EW]  0272 Trending of electrolytes indicate stable creatinine.   [EW]  5366  No acute abnormality, images reviewed by me  CT Head Wo Contrast [EW]  4599 No fracture or dislocation, hardware intact, images reviewed by me  CT Cervical Spine Wo Contrast [EW]    Clinical Course User Index [EW] Daleen Bo, MD     Patient Vitals for the past 24 hrs:  BP Temp Temp src Pulse Resp SpO2 Height Weight  11/23/18 1215 129/87 - - 78 13 100 % - -  11/23/18 1030 124/81 - - 82 17 99 % - -  11/23/18 0959 - - - - - - 5\' 9"  (1.753 m) 88.5 kg  11/23/18 0958 - 98.6 F (37 C) Oral 88 - 99 % - -    1:09 PM Reevaluation with update and discussion. After initial assessment and treatment, an updated evaluation reveals he is comfortable and feels like he is ready to go home.  He has no further complaints.  Findings discussed with patient, and wife, all questions were answered. Daleen Bo   Medical Decision Making: Syncopal episode, likely multifactorial, without serious injury.  Doubt ACS, PE or pneumonia.  Evaluate for injuries including disruption of surgical hardware or new fracture of the cervical spine, testing is reassuring.  No indication for further treatment in ED setting or hospitalization at this time.  CRITICAL CARE-yes Performed by: Daleen Bo   Nursing Notes Reviewed/ Care Coordinated Applicable Imaging Reviewed Interpretation of Laboratory  Data incorporated into ED treatment  The patient appears reasonably screened and/or stabilized for discharge and I doubt any other medical condition or other [facility] requiring further screening, evaluation, or treatment in the ED at this time prior to discharge.  Plan: Home Medications-continue usual medication, try to wean off narcotic and skeletal muscle; Home Treatments-wound care as usual, increase oral fluids and food; return here if the recommended treatment, does not improve the symptoms; Recommended follow up-PCP, PRN.  Neurosurgery follow-up as scheduled.    Final Clinical Impressions(s) / ED Diagnoses   Final diagnoses:  Syncope, unspecified syncope type    ED Discharge Orders    None       Daleen Bo, MD 11/23/18 2040

## 2018-11-23 NOTE — ED Notes (Signed)
Patient verbalizes understanding of discharge instructions. Opportunity for questioning and answers were provided. Armband removed by staff, pt discharged from ED via wheelchair to home.  

## 2018-12-05 DIAGNOSIS — H5203 Hypermetropia, bilateral: Secondary | ICD-10-CM | POA: Diagnosis not present

## 2018-12-05 DIAGNOSIS — H524 Presbyopia: Secondary | ICD-10-CM | POA: Diagnosis not present

## 2018-12-05 DIAGNOSIS — H2513 Age-related nuclear cataract, bilateral: Secondary | ICD-10-CM | POA: Diagnosis not present

## 2019-01-09 DIAGNOSIS — M5412 Radiculopathy, cervical region: Secondary | ICD-10-CM | POA: Diagnosis not present

## 2019-02-06 DIAGNOSIS — E890 Postprocedural hypothyroidism: Secondary | ICD-10-CM | POA: Diagnosis not present

## 2019-02-06 DIAGNOSIS — C73 Malignant neoplasm of thyroid gland: Secondary | ICD-10-CM | POA: Diagnosis not present

## 2019-02-13 DIAGNOSIS — C73 Malignant neoplasm of thyroid gland: Secondary | ICD-10-CM | POA: Diagnosis not present

## 2019-02-13 DIAGNOSIS — E890 Postprocedural hypothyroidism: Secondary | ICD-10-CM | POA: Diagnosis not present

## 2019-09-05 IMAGING — MR MR CERVICAL SPINE W/O CM
4 of 5 series · 27 of 48 positions shown · non-contrast
Comparison: None available.

CLINICAL DATA: Initial evaluation for acute neck pain radiating
into right shoulder blade.

EXAM:
MRI CERVICAL SPINE WITHOUT CONTRAST
TECHNIQUE: Multiplanar, multisequence MR imaging of the cervical spine was
performed. No intravenous contrast was administered.

[Series 6: T1 · sagittal · 3.0mm · 0.66mm/px · 6 of 15 slices shown]
[im 1/15]
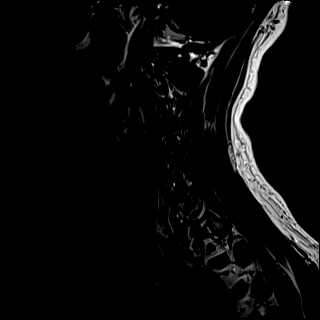
[im 3/15]
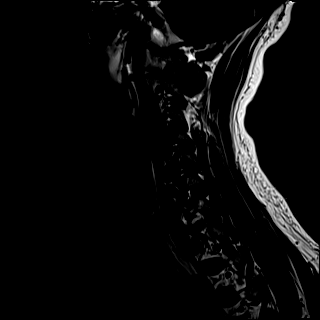
[im 6/15]
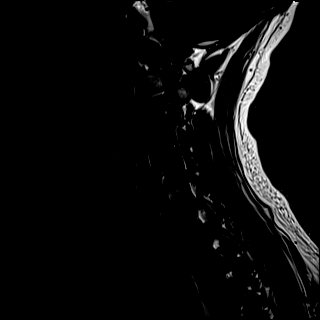
[im 9/15]
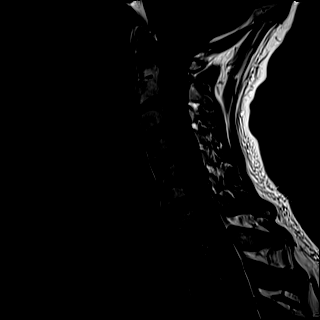
[im 12/15]
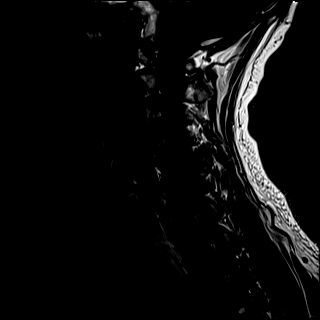
[im 15/15]
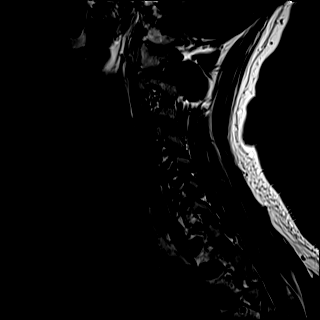

[Series 7: T2 · sagittal · 3.0mm · 0.55mm/px · 6 of 15 slices shown (1 of 2)]
[im 1/15]
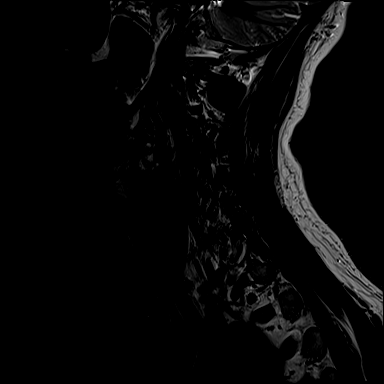
[im 3/15]
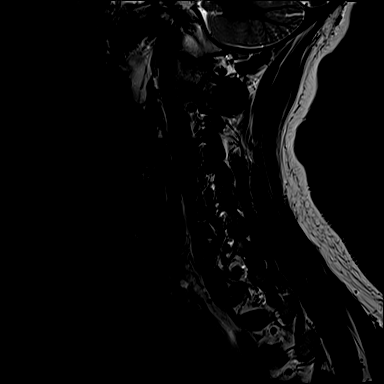
[im 6/15]
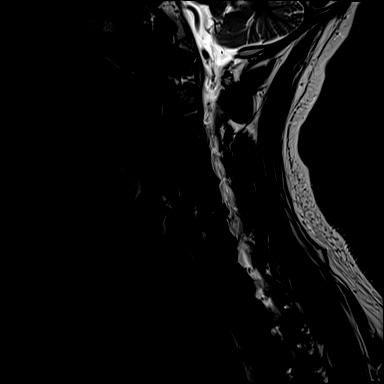
[im 9/15]
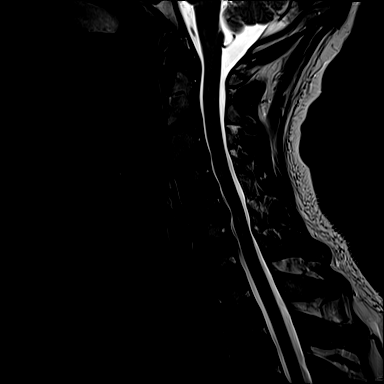
[im 12/15]
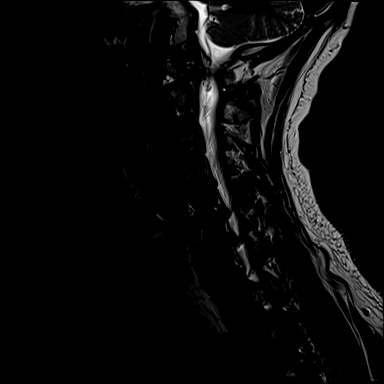
[im 15/15]
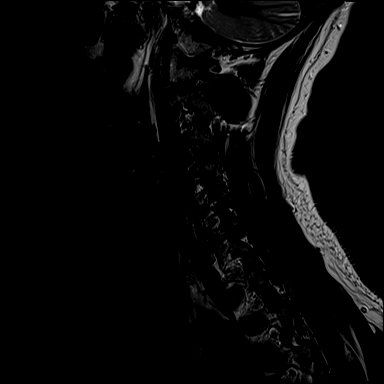

[Series 8: STIR · sagittal · 3.0mm · 0.33mm/px · 6 of 15 slices shown]
[im 1/15]
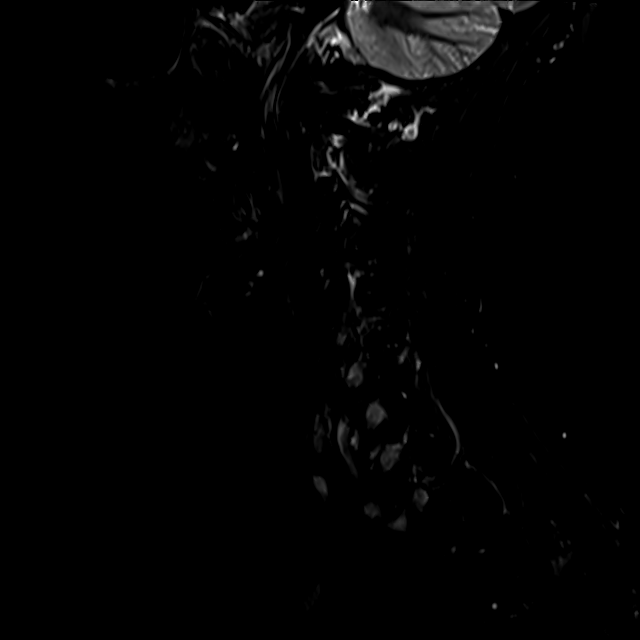
[im 3/15]
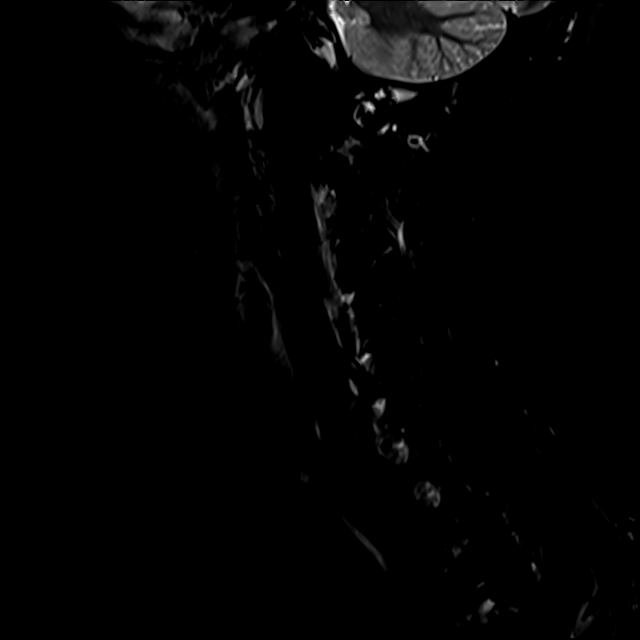
[im 6/15]
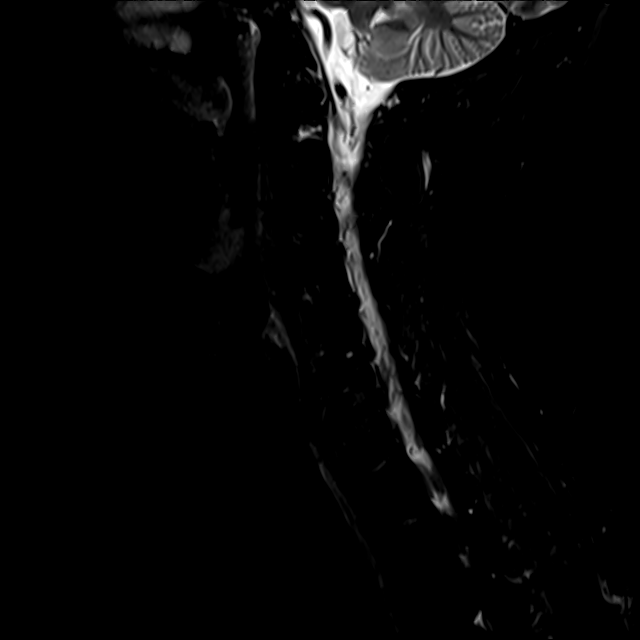
[im 9/15]
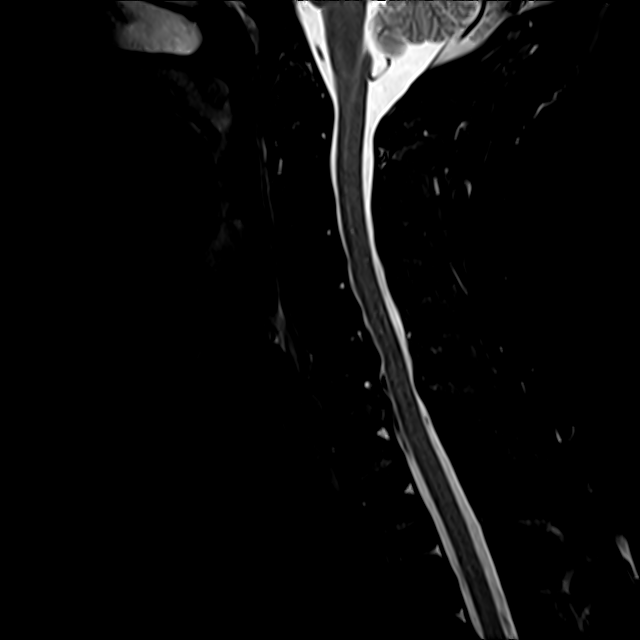
[im 12/15]
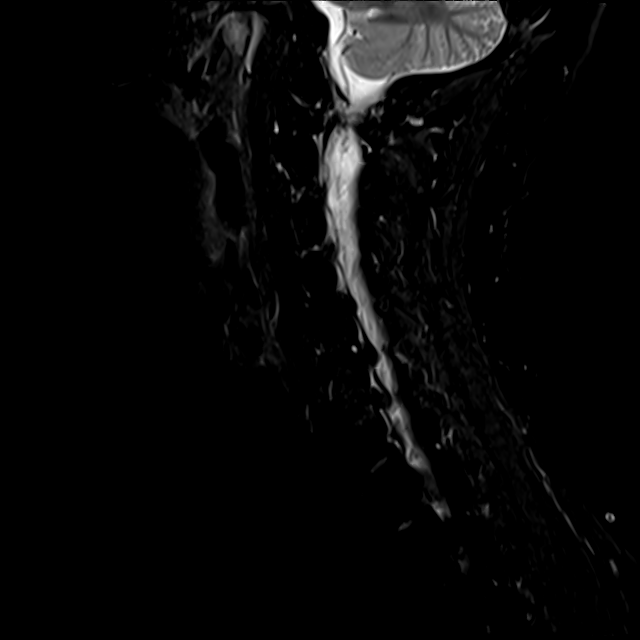
[im 15/15]
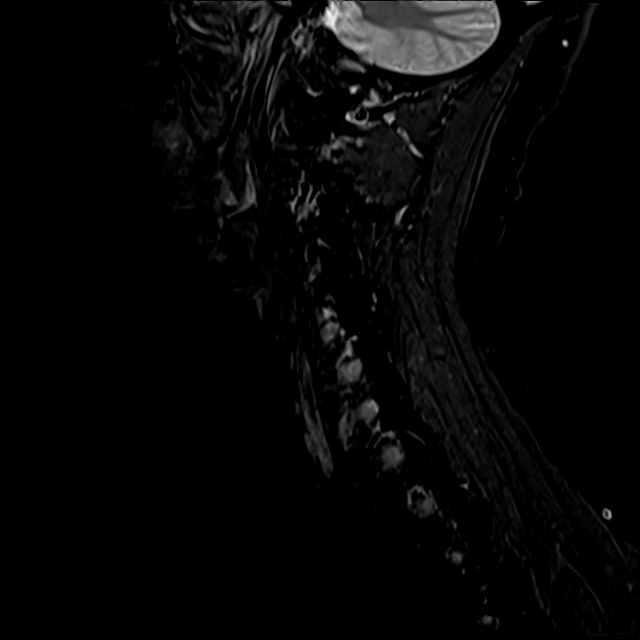

[Series 9: T2 · axial · 3.0mm · 0.50mm/px · z∈[-73,+36]mm · 9 of 35 slices shown (2 of 2)]
[im 1/35]
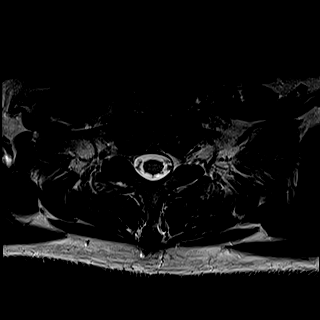
[im 5/35]
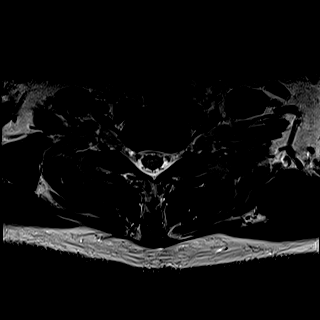
[im 10/35]
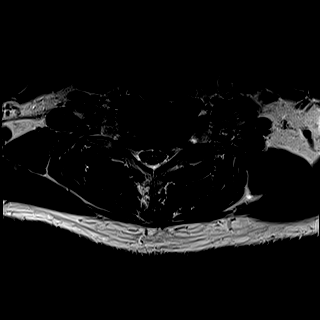
[im 15/35]
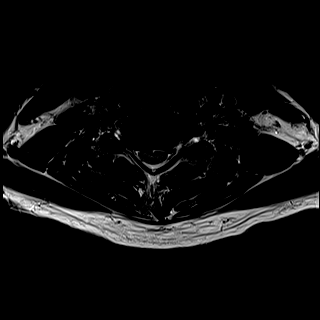
[im 18/35]
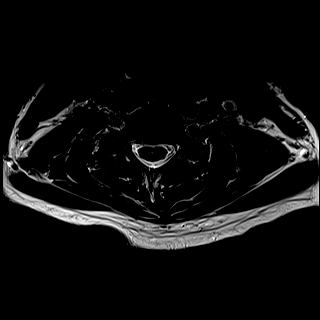
[im 20/35]
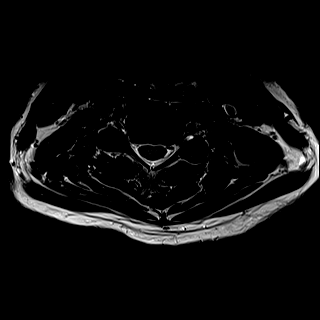
[im 25/35]
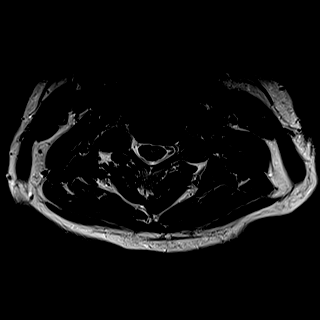
[im 30/35]
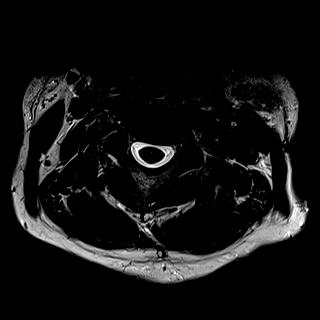
[im 35/35]
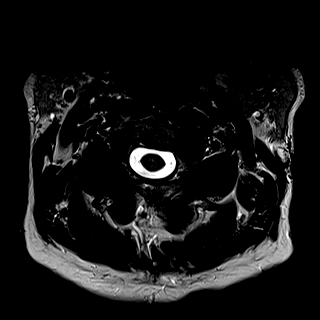

[27 of 48 positions shown; findings below may reference images not displayed]

FINDINGS: Alignment: Straightening of the normal cervical lordosis. No
listhesis.

Vertebrae: Vertebral body heights maintained without evidence for
acute or chronic fracture. Bone marrow signal intensity within
normal limits. No discrete or worrisome osseous lesion. No abnormal
marrow edema.

Cord: Signal intensity within the cervical spinal cord is normal.

Posterior Fossa, vertebral arteries, paraspinal tissues: Visualized
brain and posterior fossa within normal limits. Craniocervical
junction normal. Paraspinous and prevertebral soft tissues normal.
The intravascular flow voids present within the vertebral arteries
bilaterally.

Disc levels:

C2-C3: Mild left-sided facet hypertrophy. Otherwise unremarkable. No
stenosis.

C3-C4: Mild diffuse disc bulge. Bilateral uncovertebral hypertrophy,
slightly greater on the right. No significant spinal stenosis. Mild
right C4 foraminal narrowing.

C4-C5: Diffuse disc bulge with bilateral uncovertebral hypertrophy.
No significant spinal stenosis. Moderate right with mild left C5
foraminal narrowing.

C5-C6: Diffuse degenerative disc bulge with disc desiccation and
intervertebral disc space narrowing. Disc bulging eccentric to the
right. Right worse than left uncovertebral spurring. Disc osteophyte
complex indents the right ventral thecal sac sac with mild
flattening of the right hemi cord. Severe right C6 foraminal
stenosis.

C6-C7: Chronic circumferential disc osteophyte complex with
intervertebral disc space narrowing. Superimposed right foraminal
disc extrusion extends into the right neural foramen (series 7,
image 7). Probable right C7 nerve root impingement. Mild spinal
stenosis related disc bulge. Mild to moderate left foraminal
narrowing due to disc bulge and uncovertebral disease.

C7-T1:  Unremarkable.

The visualized upper thoracic spine within normal limits.
IMPRESSION: 1. Right foraminal disc extrusion at C6-7 with probable right C7
nerve root impingement.
2. Right eccentric disc osteophyte at C5-6, flattening the right
hemi cord with secondary severe right C6 foraminal stenosis.
3. Mild noncompressive disc bulging with uncovertebral hypertrophy
at C3-4 and C4-5 without spinal stenosis. Mild right C4 with
moderate right C5 foraminal narrowing.

## 2019-09-06 ENCOUNTER — Other Ambulatory Visit: Payer: Self-pay | Admitting: Internal Medicine

## 2019-11-06 ENCOUNTER — Encounter: Payer: 59 | Admitting: Internal Medicine

## 2019-11-20 ENCOUNTER — Ambulatory Visit (INDEPENDENT_AMBULATORY_CARE_PROVIDER_SITE_OTHER): Payer: 59 | Admitting: Internal Medicine

## 2019-11-20 ENCOUNTER — Other Ambulatory Visit: Payer: Self-pay

## 2019-11-20 ENCOUNTER — Encounter: Payer: Self-pay | Admitting: Internal Medicine

## 2019-11-20 VITALS — BP 120/78 | HR 60 | Temp 98.2°F | Ht 68.5 in | Wt 199.0 lb

## 2019-11-20 DIAGNOSIS — M5020 Other cervical disc displacement, unspecified cervical region: Secondary | ICD-10-CM | POA: Diagnosis not present

## 2019-11-20 DIAGNOSIS — M19041 Primary osteoarthritis, right hand: Secondary | ICD-10-CM | POA: Diagnosis not present

## 2019-11-20 DIAGNOSIS — Z125 Encounter for screening for malignant neoplasm of prostate: Secondary | ICD-10-CM

## 2019-11-20 DIAGNOSIS — M1990 Unspecified osteoarthritis, unspecified site: Secondary | ICD-10-CM | POA: Insufficient documentation

## 2019-11-20 DIAGNOSIS — K219 Gastro-esophageal reflux disease without esophagitis: Secondary | ICD-10-CM

## 2019-11-20 DIAGNOSIS — E890 Postprocedural hypothyroidism: Secondary | ICD-10-CM | POA: Diagnosis not present

## 2019-11-20 DIAGNOSIS — Z0001 Encounter for general adult medical examination with abnormal findings: Secondary | ICD-10-CM

## 2019-11-20 DIAGNOSIS — M19042 Primary osteoarthritis, left hand: Secondary | ICD-10-CM

## 2019-11-20 LAB — COMPREHENSIVE METABOLIC PANEL
ALT: 8 U/L (ref 0–53)
AST: 17 U/L (ref 0–37)
Albumin: 3.9 g/dL (ref 3.5–5.2)
Alkaline Phosphatase: 43 U/L (ref 39–117)
BUN: 13 mg/dL (ref 6–23)
CO2: 29 mEq/L (ref 19–32)
Calcium: 9 mg/dL (ref 8.4–10.5)
Chloride: 106 mEq/L (ref 96–112)
Creatinine, Ser: 1.3 mg/dL (ref 0.40–1.50)
GFR: 56.32 mL/min — ABNORMAL LOW (ref >60.00)
Glucose, Bld: 89 mg/dL (ref 70–99)
Potassium: 4.6 mEq/L (ref 3.5–5.1)
Sodium: 141 mEq/L (ref 135–145)
Total Bilirubin: 0.8 mg/dL (ref 0.2–1.2)
Total Protein: 6.5 g/dL (ref 6.0–8.3)

## 2019-11-20 LAB — LIPID PANEL
Cholesterol: 194 mg/dL (ref 0–200)
HDL: 38.4 mg/dL — ABNORMAL LOW (ref >39.00)
LDL Cholesterol: 139 mg/dL — ABNORMAL HIGH (ref 0–99)
NonHDL: 155.41
Total CHOL/HDL Ratio: 5
Triglycerides: 83 mg/dL (ref 0.0–149.0)
VLDL: 16.6 mg/dL (ref 0.0–40.0)

## 2019-11-20 LAB — CBC
HCT: 39.2 % (ref 39.0–52.0)
Hemoglobin: 13 g/dL (ref 13.0–17.0)
MCHC: 33.3 g/dL (ref 30.0–36.0)
MCV: 89.2 fl (ref 78.0–100.0)
Platelets: 270 10*3/uL (ref 150.0–400.0)
RBC: 4.39 Mil/uL (ref 4.22–5.81)
RDW: 14.4 % (ref 11.5–15.5)
WBC: 5 10*3/uL (ref 4.0–10.5)

## 2019-11-20 LAB — PSA: PSA: 0.39 ng/mL (ref 0.10–4.00)

## 2019-11-20 LAB — TSH: TSH: 0.11 u[IU]/mL — ABNORMAL LOW (ref 0.35–4.50)

## 2019-11-20 LAB — T4, FREE: Free T4: 1.31 ng/dL (ref 0.60–1.60)

## 2019-11-20 NOTE — Patient Instructions (Signed)

## 2019-11-20 NOTE — Assessment & Plan Note (Signed)
Continue Naproxen prn

## 2019-11-20 NOTE — Assessment & Plan Note (Signed)
TSH and Free T4 today Continue Levothyroxine, will adjust if needed based on labs 

## 2019-11-20 NOTE — Progress Notes (Signed)
Subjective:    Patient ID: Matthew Newton, male    DOB: 27-Mar-1961, 58 y.o.   MRN: [id number]  HPI  Pt presents to the clinic today for hiss annual exam. He is also due to follow up chronic conditions.  GERD: He is not sure what triggers this. He denies breakthrough on Dexilant as needed with good relief of symptoms. There is no upper GI on file.   Hypothyroidism: He denies any issues on his current dose of Levothyroxine.   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 He takes Naproxen as needed with good relief. He is following with Dr. Ronnald Ramp.  Flu: 08/2019 Tetanus: 09/2017 Shingrix: 08/2019 PSA Screening: 10/2018 Colon Screening: 10/2012, 10 years Vision Screening: annually Dentist: biannually  Diet: He does eat meat. He consumes more veggies than fruits. He tries to avoid fried foods. He tries to avoid fried foods. Exercise: Body weight exercises 30 minutes 5 days per week.  Review of Systems      Past Medical History:  Diagnosis Date  . Cancer [facility]) 2008   thyroid  . GERD (gastroesophageal reflux disease)   . PONV (postoperative nausea and vomiting)     Current Outpatient Medications  Medication Sig Dispense Refill  . cetirizine (ZYRTEC) 10 MG tablet Take 10 mg by mouth daily. Takes as needed    . DEXILANT 60 MG capsule TAKE 1 CAPSULE BY MOUTH DAILY AS NEEDED 30 capsule 1  . levothyroxine (SYNTHROID, LEVOTHROID) 100 MCG tablet Take 100 mcg by mouth daily.  10  . naproxen sodium (ALEVE) 220 MG tablet Take 220 mg by mouth daily as needed.     No current facility-administered medications for this visit.     Allergies  Allergen Reactions  . Contrast Media [Iodinated Diagnostic Agents] Shortness Of Breath    Pt states it "closed his chest up" and he "couldn't breathe".  . Metrizamide Shortness Of Breath    Pt states it "closed his chest up" and he "couldn't breathe".    Family History  Problem Relation Age of Onset  . Cancer  Mother        stomach  . Heart disease Father   . Diabetes Neg Hx   . Stroke Neg Hx     Social History   Socioeconomic History  . Marital status: Married    Spouse name: Not on file  . Number of children: Not on file  . Years of education: Not on file  . Highest education level: Not on file  Occupational History  . Not on file  Social Needs  . Financial resource strain: Not on file  . Food insecurity    Worry: Not on file    Inability: Not on file  . Transportation needs    Medical: Not on file    Non-medical: Not on file  Tobacco Use  . Smoking status: Never Smoker  . Smokeless tobacco: Never Used  Substance and Sexual Activity  . Alcohol use: No  . Drug use: No  . Sexual activity: Yes  Lifestyle  . Physical activity    Days per week: Not on file    Minutes per session: Not on file  . Stress: Not on file  Relationships  . Social Herbalist on phone: Not on file    Gets together: Not on file    Attends religious service: Not on file    Active member of club or organization: Not on file    Attends  meetings of clubs or organizations: Not on file    Relationship status: Not on file  . Intimate partner violence    Fear of current or ex partner: Not on file    Emotionally abused: Not on file    Physically abused: Not on file    Forced sexual activity: Not on file  Other Topics Concern  . Not on file  Social History Narrative  . Not on file     Constitutional: Denies fever, malaise, fatigue, headache or abrupt weight changes.  HEENT: Denies eye pain, eye redness, ear pain, ringing in the ears, wax buildup, runny nose, nasal congestion, bloody nose, or sore throat. Respiratory: Denies difficulty breathing, shortness of breath, cough or sputum production.   Cardiovascular: Denies chest pain, chest tightness, palpitations or swelling in the hands or feet.  Gastrointestinal: Denies abdominal pain, bloating, constipation, diarrhea or blood in the stool.   GU: Denies urgency, frequency, pain with urination, burning sensation, blood in urine, odor or discharge. Musculoskeletal: Pt reports intermittent joint pain in hands. Denies decrease in range of motion, difficulty with gait, muscle pain or joint swelling.  Skin: Denies redness, rashes, lesions or ulcercations.  Neurological: Denies dizziness, difficulty with memory, difficulty with speech or problems with balance and coordination.  Psych: Denies anxiety, depression, SI/HI.  No other specific complaints in a complete review of systems (except as listed in HPI above).  Objective:   Physical Exam  BP 120/78   Pulse 60   Temp 98.2 F (36.8 C) (Temporal)   Ht 5' 8.5" (1.74 m)   Wt 199 lb (90.3 kg)   SpO2 98%   BMI 29.82 kg/m   Wt Readings from Last 3 Encounters:  11/23/18 195 lb (88.5 kg)  10/31/18 200 lb (90.7 kg)  09/27/18 200 lb (90.7 kg)    General: Appears his stated age, well developed, well nourished in NAD. Skin: Warm, dry and intact. No rashesnoted. HEENT: Head: normal shape and size; Eyes: sclera white, no icterus, conjunctiva pink, PERRLA and EOMs intact; Ears: Tm's gray and intact, normal light reflex;  Neck:  Neck supple, trachea midline. No masses, lumps or thyromegaly present.  Cardiovascular: Normal rate and rhythm. S1,S2 noted.  No murmur, rubs or gallops noted. No JVD or BLE edema. No carotid bruits noted. Pulmonary/Chest: Normal effort and positive vesicular breath sounds. No respiratory distress. No wheezes, rales or ronchi noted.  Abdomen: Soft and nontender. Normal bowel sounds. No distention or masses noted. Liver, spleen and kidneys non palpable. Musculoskeletal: Herbdens node noted of left index finger. Strength 5/5 BUE/BLE. No difficulty with gait.  Neurological: Alert and oriented. Cranial nerves II-XII grossly intact. Coordination normal.  Psychiatric: Mood and affect normal. Behavior is normal. Judgment and thought content normal.     BMET     Component Value Date/Time   NA 136 11/23/2018 1014   K 3.4 (L) 11/23/2018 1014   CL 100 11/23/2018 1014   CO2 27 11/23/2018 1014   GLUCOSE 116 (H) 11/23/2018 1014   BUN 6 11/23/2018 1014   CREATININE 1.45 (H) 11/23/2018 1014   CALCIUM 7.9 (L) 11/23/2018 1014   GFRNONAA 53 (L) 11/23/2018 1014   GFRAA >60 11/23/2018 1014    Lipid Panel     Component Value Date/Time   CHOL 185 10/31/2018 0845   TRIG 58.0 10/31/2018 0845   HDL 42.70 10/31/2018 0845   CHOLHDL 4 10/31/2018 0845   VLDL 11.6 10/31/2018 0845   LDLCALC 131 (H) 10/31/2018 0845    CBC  Component Value Date/Time   WBC 13.3 (H) 11/23/2018 1014   RBC 4.55 11/23/2018 1014   HGB 13.1 11/23/2018 1014   HCT 41.8 11/23/2018 1014   PLT 212 11/23/2018 1014   MCV 91.9 11/23/2018 1014   MCH 28.8 11/23/2018 1014   MCHC 31.3 11/23/2018 1014   RDW 12.8 11/23/2018 1014   LYMPHSABS 1.3 11/23/2018 1014   MONOABS 0.9 11/23/2018 1014   EOSABS 0.2 11/23/2018 1014   BASOSABS 0.0 11/23/2018 1014    Hgb A1C No results found for: HGBA1C          Assessment & Plan:   Preventative Health Maintenance:  Flu shot UTD Tetanus UTD Colon screening UTD Encouraged him to consume a balanced diet and exercise regimen Advised him to see an eye doctor and dentist annually Will check CBC, CMET, TSH, Free T4, Lipid and PSA today  RTC in 1 year, sooner in needed Webb Silversmith, NP This visit occurred during the SARS-CoV-2 public health emergency.  Safety protocols were in place, including screening questions prior to the visit, additional usage of staff PPE, and extensive cleaning of exam room while observing appropriate contact time as indicated for disinfecting solutions.

## 2019-11-20 NOTE — Assessment & Plan Note (Signed)
Intermittent Try to identify triggers and avoid them CBC and CMET today Continue Dexilant prn

## 2019-11-20 NOTE — Assessment & Plan Note (Signed)
Encouraged use of Naproxen prn If deteriorates, consider xray, RF workup and Meloxicam

## 2020-07-25 ENCOUNTER — Other Ambulatory Visit: Payer: Self-pay | Admitting: Internal Medicine

## 2020-07-25 DIAGNOSIS — N1831 Chronic kidney disease, stage 3a: Secondary | ICD-10-CM

## 2020-07-29 ENCOUNTER — Ambulatory Visit
Admission: RE | Admit: 2020-07-29 | Discharge: 2020-07-29 | Disposition: A | Discharge status: Home / Self Care | Payer: 59 | Source: Ambulatory Visit | Attending: Internal Medicine | Admitting: Internal Medicine

## 2020-07-29 DIAGNOSIS — N1831 Chronic kidney disease, stage 3a: Secondary | ICD-10-CM

## 2020-09-17 ENCOUNTER — Other Ambulatory Visit: Payer: Self-pay | Admitting: Internal Medicine

## 2020-11-26 ENCOUNTER — Ambulatory Visit (INDEPENDENT_AMBULATORY_CARE_PROVIDER_SITE_OTHER): Payer: 59 | Admitting: Internal Medicine

## 2020-11-26 ENCOUNTER — Encounter: Payer: Self-pay | Admitting: Internal Medicine

## 2020-11-26 ENCOUNTER — Other Ambulatory Visit: Payer: Self-pay

## 2020-11-26 VITALS — BP 122/78 | HR 76 | Temp 97.7°F | Ht 68.33 in | Wt 198.0 lb

## 2020-11-26 DIAGNOSIS — M19042 Primary osteoarthritis, left hand: Secondary | ICD-10-CM | POA: Diagnosis not present

## 2020-11-26 DIAGNOSIS — K219 Gastro-esophageal reflux disease without esophagitis: Secondary | ICD-10-CM | POA: Diagnosis not present

## 2020-11-26 DIAGNOSIS — Z0001 Encounter for general adult medical examination with abnormal findings: Secondary | ICD-10-CM | POA: Diagnosis not present

## 2020-11-26 DIAGNOSIS — E7800 Pure hypercholesterolemia, unspecified: Secondary | ICD-10-CM

## 2020-11-26 DIAGNOSIS — Z125 Encounter for screening for malignant neoplasm of prostate: Secondary | ICD-10-CM

## 2020-11-26 DIAGNOSIS — N1831 Chronic kidney disease, stage 3a: Secondary | ICD-10-CM

## 2020-11-26 DIAGNOSIS — M5020 Other cervical disc displacement, unspecified cervical region: Secondary | ICD-10-CM

## 2020-11-26 DIAGNOSIS — M19041 Primary osteoarthritis, right hand: Secondary | ICD-10-CM

## 2020-11-26 DIAGNOSIS — E890 Postprocedural hypothyroidism: Secondary | ICD-10-CM | POA: Diagnosis not present

## 2020-11-26 LAB — LIPID PANEL
Cholesterol: 221 mg/dL — ABNORMAL HIGH (ref 0–200)
HDL: 48 mg/dL (ref >39.00)
LDL Cholesterol: 145 mg/dL — ABNORMAL HIGH (ref 0–99)
NonHDL: 173.42
Total CHOL/HDL Ratio: 5
Triglycerides: 142 mg/dL (ref 0.0–149.0)
VLDL: 28.4 mg/dL (ref 0.0–40.0)

## 2020-11-26 LAB — SEDIMENTATION RATE: Sed Rate: 12 mm/hr (ref 0–20)

## 2020-11-26 LAB — COMPREHENSIVE METABOLIC PANEL
ALT: 14 U/L (ref 0–53)
AST: 19 U/L (ref 0–37)
Albumin: 4.5 g/dL (ref 3.5–5.2)
Alkaline Phosphatase: 52 U/L (ref 39–117)
BUN: 10 mg/dL (ref 6–23)
CO2: 32 mEq/L (ref 19–32)
Calcium: 9.3 mg/dL (ref 8.4–10.5)
Chloride: 101 mEq/L (ref 96–112)
Creatinine, Ser: 1.49 mg/dL (ref 0.40–1.50)
GFR: 50.53 mL/min — ABNORMAL LOW (ref >60.00)
Glucose, Bld: 87 mg/dL (ref 70–99)
Potassium: 4.7 mEq/L (ref 3.5–5.1)
Sodium: 138 mEq/L (ref 135–145)
Total Bilirubin: 0.6 mg/dL (ref 0.2–1.2)
Total Protein: 7.2 g/dL (ref 6.0–8.3)

## 2020-11-26 LAB — T4, FREE: Free T4: 1.03 ng/dL (ref 0.60–1.60)

## 2020-11-26 LAB — CBC
HCT: 42 % (ref 39.0–52.0)
Hemoglobin: 14 g/dL (ref 13.0–17.0)
MCHC: 33.3 g/dL (ref 30.0–36.0)
MCV: 90.3 fl (ref 78.0–100.0)
Platelets: 275 10*3/uL (ref 150.0–400.0)
RBC: 4.65 Mil/uL (ref 4.22–5.81)
RDW: 14 % (ref 11.5–15.5)
WBC: 6.9 10*3/uL (ref 4.0–10.5)

## 2020-11-26 LAB — TSH: TSH: 1.08 u[IU]/mL (ref 0.35–4.50)

## 2020-11-26 LAB — PSA: PSA: 0.36 ng/mL (ref 0.10–4.00)

## 2020-11-26 LAB — HEMOGLOBIN A1C: Hgb A1c MFr Bld: 5.6 % (ref 4.6–6.5)

## 2020-11-26 NOTE — Progress Notes (Signed)
Subjective:    Patient ID: Matthew Newton, male    DOB: [birth year], 59 y.o.   MRN: [id number]  HPI  Patient presents the clinic today for his annual exam.  He is also due to follow-up chronic conditions.  GERD: He is not sure what his triggers are.  He takes Dexilant as needed.  There is no upper GI on file.  Hypothyroidism: He denies any issues on his current dose of Levothyroxine.  He does follows with endocrinology.  Ruptured Cervical Disc: CT cervical spine from 10/2018 reviewed.  Status post discectomy.  He takes Tylenol as needed with good relief.  He follows with neurosurgery.  CKD 3: Creatinine 1.3, GFR 56.32. He avoids NSAID's OTC. He follows with nephrology.  OA: Mainly in his his hands. He take Tylenol as needed with some relief of symptoms.   Flu: 07/2020 Tetanus: 09/2017 Covid: never Shingrix: 08/2019, 02/2020 PSA screening: 10/2019 Colon screening: 10/2012, 10 years Vision screening: Annually Dentist: Biannually  Diet: He does eat meat.  He consumes more veggies and fruit.  He tries to avoid fried foods.  He drinks mostly water or sweet tea. Exercise: Body weight exercise 30 minutes at least 5 days a week.  Review of Systems      Past Medical History:  Diagnosis Date  . Cancer [facility]) 2008   thyroid  . GERD (gastroesophageal reflux disease)   . PONV (postoperative nausea and vomiting)     Current Outpatient Medications  Medication Sig Dispense Refill  . cetirizine (ZYRTEC) 10 MG tablet Take 10 mg by mouth daily. Takes as needed    . DEXILANT 60 MG capsule TAKE 1 CAPSULE BY MOUTH EVERY DAY AS NEEDED 30 capsule 2  . naproxen sodium (ALEVE) 220 MG tablet Take 220 mg by mouth daily as needed.    Marland Kitchen levothyroxine (SYNTHROID) 88 MCG tablet Take 88 mcg by mouth daily.     No current facility-administered medications for this visit.    Allergies  Allergen Reactions  . Contrast Media [Iodinated Diagnostic Agents] Shortness Of Breath    Pt states it "closed his  chest up" and he "couldn't breathe".  . Metrizamide Shortness Of Breath    Pt states it "closed his chest up" and he "couldn't breathe".    Family History  Problem Relation Age of Onset  . Cancer Mother        stomach  . Heart disease Father   . Diabetes Neg Hx   . Stroke Neg Hx     Social History   Socioeconomic History  . Marital status: Married    Spouse name: Not on file  . Number of children: Not on file  . Years of education: Not on file  . Highest education level: Not on file  Occupational History  . Not on file  Tobacco Use  . Smoking status: Never Smoker  . Smokeless tobacco: Never Used  Substance and Sexual Activity  . Alcohol use: No  . Drug use: No  . Sexual activity: Yes  Other Topics Concern  . Not on file  Social History Narrative  . Not on file   Social Determinants of Health   Financial Resource Strain:   . Difficulty of Paying Living Expenses: Not on file  Food Insecurity:   . Worried About Charity fundraiser in the Last Year: Not on file  . Ran Out of Food in the Last Year: Not on file  Transportation Needs:   . Lack of Transportation (Medical): Not  on file  . Lack of Transportation (Non-Medical): Not on file  Physical Activity:   . Days of Exercise per Week: Not on file  . Minutes of Exercise per Session: Not on file  Stress:   . Feeling of Stress : Not on file  Social Connections:   . Frequency of Communication with Friends and Family: Not on file  . Frequency of Social Gatherings with Friends and Family: Not on file  . Attends Religious Services: Not on file  . Active Member of Clubs or Organizations: Not on file  . Attends Archivist Meetings: Not on file  . Marital Status: Not on file  Intimate Partner Violence:   . Fear of Current or Ex-Partner: Not on file  . Emotionally Abused: Not on file  . Physically Abused: Not on file  . Sexually Abused: Not on file     Constitutional: Denies fever, malaise, fatigue, headache  or abrupt weight changes.  HEENT: Pt reports intermittent abnormal sensation of tongue. Denies eye pain, eye redness, ear pain, ringing in the ears, wax buildup, runny nose, nasal congestion, bloody nose, or sore throat. Respiratory: Denies difficulty breathing, shortness of breath, cough or sputum production.   Cardiovascular: Denies chest pain, chest tightness, palpitations or swelling in the hands or feet.  Gastrointestinal: Denies abdominal pain, bloating, constipation, diarrhea or blood in the stool.  GU: Denies urgency, frequency, pain with urination, burning sensation, blood in urine, odor or discharge. Musculoskeletal: Pt reports intermittent neck pain, joint pain and swelling in hands. Denies decrease in range of motion, difficulty with gait, muscle pain.  Skin: Denies redness, rashes, lesions or ulcercations.  Neurological: Denies dizziness, difficulty with memory, difficulty with speech or problems with balance and coordination.  Psych: Denies anxiety, depression, SI/HI.  No other specific complaints in a complete review of systems (except as listed in HPI above).  Objective:   Physical Exam BP 122/78   Pulse 76   Temp 97.7 F (36.5 C) (Temporal)   Ht 5' 8.33" (1.736 m)   Wt 198 lb (89.8 kg)   SpO2 98%   BMI 29.82 kg/m   Wt Readings from Last 3 Encounters:  11/20/19 199 lb (90.3 kg)  11/23/18 195 lb (88.5 kg)  10/31/18 200 lb (90.7 kg)    General: Appears his stated age, well developed, well nourished in NAD. Skin: Warm, dry and intact. No rashes noted. HEENT: Head: normal shape and size; Eyes: sclera white, no icterus, conjunctiva pink, PERRLA and EOMs intact; Throat/Mouth: Teeth present, mucosa pink and moist, no exudate, lesions or ulcerations noted.  Neck:  Neck supple, trachea midline. .  Cardiovascular: Normal rate and rhythm. S1,S2 noted.  No murmur, rubs or gallops noted. No JVD or BLE edema. No carotid bruits noted. Pulmonary/Chest: Normal effort and positive  vesicular breath sounds. No respiratory distress. No wheezes, rales or ronchi noted.  Abdomen: Soft and nontender. Normal bowel sounds. No distention or masses noted. Liver, spleen and kidneys non palpable. Musculoskeletal: Strength 5/5 BUE/BLE.  No difficulty with gait.  Neurological: Alert and oriented. Cranial nerves II-XII grossly intact. Coordination normal.  Psychiatric: Mood and affect normal. Behavior is normal. Judgment and thought content normal.     BMET    Component Value Date/Time   NA 141 11/20/2019 1241   K 4.6 11/20/2019 1241   CL 106 11/20/2019 1241   CO2 29 11/20/2019 1241   GLUCOSE 89 11/20/2019 1241   BUN 13 11/20/2019 1241   CREATININE 1.30 11/20/2019 1241  CALCIUM 9.0 11/20/2019 1241   GFRNONAA 53 (L) 11/23/2018 1014   GFRAA >60 11/23/2018 1014    Lipid Panel     Component Value Date/Time   CHOL 194 11/20/2019 1241   TRIG 83.0 11/20/2019 1241   HDL 38.40 (L) 11/20/2019 1241   CHOLHDL 5 11/20/2019 1241   VLDL 16.6 11/20/2019 1241   LDLCALC 139 (H) 11/20/2019 1241    CBC    Component Value Date/Time   WBC 5.0 11/20/2019 1241   RBC 4.39 11/20/2019 1241   HGB 13.0 11/20/2019 1241   HCT 39.2 11/20/2019 1241   PLT 270.0 11/20/2019 1241   MCV 89.2 11/20/2019 1241   MCH 28.8 11/23/2018 1014   MCHC 33.3 11/20/2019 1241   RDW 14.4 11/20/2019 1241   LYMPHSABS 1.3 11/23/2018 1014   MONOABS 0.9 11/23/2018 1014   EOSABS 0.2 11/23/2018 1014   BASOSABS 0.0 11/23/2018 1014    Hgb A1C No results found for: HGBA1C          Assessment & Plan:   Preventative Health Maintenance:  Flu shot UTD Tetanus UTD Covid UTD Shingrix UTD Colon screening UTD Encouraged him to consume a balanced diet exercise regimen Advised him to see an eye doctor and dentist annually We will check CBC, C met, TSH, free T4, lipid, A1c and PSA today  RTC in 1 year, sooner if needed Webb Silversmith, NP This visit occurred during the SARS-CoV-2 public health emergency.   Safety protocols were in place, including screening questions prior to the visit, additional usage of staff PPE, and extensive cleaning of exam room while observing appropriate contact time as indicated for disinfecting solutions.

## 2020-11-26 NOTE — Patient Instructions (Signed)

## 2020-11-26 NOTE — Assessment & Plan Note (Signed)
Family history of RA, will check ESR, CRP, ANA, RF Continue Tylenol as needed for now

## 2020-11-26 NOTE — Assessment & Plan Note (Signed)
CBC and C met today Continue Dexilant as needed

## 2020-11-26 NOTE — Assessment & Plan Note (Signed)
Continue Tylenol as needed We will monitor

## 2020-11-26 NOTE — Assessment & Plan Note (Signed)
C met today Avoid NSAIDs

## 2020-11-26 NOTE — Assessment & Plan Note (Signed)
TSH and free T4 today Levothyroxine managed by endocrinology

## 2020-11-28 LAB — ANA: Anti Nuclear Antibody (ANA): NEGATIVE

## 2020-11-28 LAB — RHEUMATOID FACTOR: Rhuematoid fact SerPl-aCnc: <14 IU/mL (ref <14)

## 2020-12-02 NOTE — Addendum Note (Signed)
Addended by: Lurlean Nanny on: 12/02/2020 12:08 PM   Modules accepted: Orders

## 2021-02-26 ENCOUNTER — Other Ambulatory Visit (INDEPENDENT_AMBULATORY_CARE_PROVIDER_SITE_OTHER): Payer: 59

## 2021-02-26 ENCOUNTER — Other Ambulatory Visit: Payer: Self-pay

## 2021-02-26 DIAGNOSIS — E7800 Pure hypercholesterolemia, unspecified: Secondary | ICD-10-CM

## 2021-02-26 LAB — LIPID PANEL
Cholesterol: 196 mg/dL (ref 0–200)
HDL: 43.4 mg/dL
LDL Cholesterol: 139 mg/dL — ABNORMAL HIGH (ref 0–99)
NonHDL: 152.44
Total CHOL/HDL Ratio: 5
Triglycerides: 67 mg/dL (ref 0.0–149.0)
VLDL: 13.4 mg/dL (ref 0.0–40.0)

## 2021-04-21 ENCOUNTER — Encounter: Payer: Self-pay | Admitting: Neurology

## 2021-04-21 ENCOUNTER — Ambulatory Visit: Payer: 59 | Admitting: Neurology

## 2021-04-21 VITALS — BP 140/83 | HR 70 | Ht 68.5 in | Wt 193.5 lb

## 2021-04-21 DIAGNOSIS — L299 Pruritus, unspecified: Secondary | ICD-10-CM | POA: Diagnosis not present

## 2021-04-21 NOTE — Progress Notes (Signed)
Chief Complaint  Patient presents with  . New Patient (Initial Visit)    He is here with his wife, Rise Paganini. He has a short period of time with intermittent pins and needles throughout his body that has mostly resolved. He has also noticed mild, left-sided facial droop present at least since Nov-Dec of last year. His wife says she has noticed a stumbling gait. Denies any falls.       ASSESSMENT AND PLAN  Matthew Newton is a 60 y.o. male   Paresthesia  Improved with B12 supplement  Check D22 level, also folic acid vitamin D level  History of cervical decompression  There is no evidence of cervical radiculopathy or myelopathy on examination  Complains of left droopy eyelid,  No evidence of orbicularis oculi weakness, or bulbar limb muscle weakness  More than likely mild asymmetry due to excessive soft tissue of left upper eyelid     DIAGNOSTIC DATA (LABS, IMAGING, TESTING) - I reviewed patient records, labs, notes, testing and imaging myself where available.   HISTORICAL  Matthew Newton is a 60 year old male, seen in request by his endocrinologist Dr. Chalmers Cater, Mindi Curling, for evaluation of constellation of complaints, including intense itching, left droopy eyelid, his primary care physician is Dr. Ronnald Ramp, [facility] Right, he is accompanied by his wife at today's visit on April 21, 2021  I reviewed and summarized the referring note. PMHX. Thyroid cancer in 2008, thyroidectomy,followed by radioactive iodine  He had a history of cervical decompression surgery in 2019, presented with right cervical radiculopathy, did not have gait abnormality prior to surgery, surgery was very helpful  In 2020, without any clear triggers, he began to noticed frequent itching between his shoulder blade, occasionally moved down to his hip, leg region, he tried to change shampoo, body wash, clothes, detergent, without helping his symptoms,  In February 2021, he was seen by his endocrinologist Dr. Chalmers Cater, with  adjustment of his thyroid supplement change from higher dose to current 88 mcg, which seems to help his itching mildly  Then in July 2021, he had intense flareup of itching, mainly between shoulder blade area, was seen by nephrologist, no etiology found, he did have a history of playing sports, multiple joints pain, has stopped frequent NSAIDs since  In February 2022, he began to noticed, irritation, bleeding easily while brushing his teeth, he also noticed mild taste change, the cold water, ketchup, salty food seems to burn his tongue,  While doing his own research, he came across possible B12 deficiency, 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  PHYSICAL EXAM:   Vitals:   04/21/21 1535  Weight: 193 lb 8 oz (87.8 kg)  Height: 5' 8.5" (1.74 m)   Not recorded     Body mass index is 28.99 kg/m.  PHYSICAL EXAMNIATION:  Gen: NAD, conversant, well nourised, well groomed                     Cardiovascular: Regular rate rhythm, no peripheral edema, warm,  nontender. Eyes: Conjunctivae clear without exudates or hemorrhage Neck: Supple, no carotid bruits. Pulmonary: Clear to auscultation bilaterally   NEUROLOGICAL EXAM:  MENTAL STATUS: Speech:    Speech is normal; fluent and spontaneous with normal comprehension.  Cognition:     Orientation to time, place and person     Normal recent and remote memory     Normal Attention span and concentration     Normal Language, naming,  repeating,spontaneous speech     Fund of knowledge   CRANIAL NERVES: CN II: Visual fields are full to confrontation. Pupils are round equal and briskly reactive to light. CN III, IV, VI: extraocular movement are normal. No ptosis. CN V: Facial sensation is intact to light touch CN VII: Face is symmetric with normal eye closure  CN VIII: Hearing is normal to causal conversation. CN IX, X: Phonation is normal. CN XI: Head turning and shoulder shrug are intact  MOTOR: There is no pronator drift of out-stretched arms. Muscle bulk and tone are normal. Muscle strength is normal.  REFLEXES: Reflexes are 2+ and symmetric at the biceps, triceps, knees, and ankles. Plantar responses are flexor.  SENSORY: Intact to light touch, pinprick and vibratory sensation are intact in fingers and toes.  COORDINATION: There is no trunk or limb dysmetria noted.  GAIT/STANCE: Posture is normal. Gait is steady with normal steps, base, arm swing, and turning. Heel and toe walking are normal. Tandem gait is normal.  Romberg is absent.  ALLERGIES: Allergies  Allergen Reactions  . Contrast Media [Iodinated Diagnostic Agents] Shortness Of Breath    Pt states it "closed his chest up" and he "couldn't breathe".  . Metrizamide Shortness Of Breath    Pt states it "closed his chest up" and he "couldn't breathe".    HOME MEDICATIONS: Current Outpatient Medications  Medication Sig Dispense Refill  . cetirizine (ZYRTEC) 10 MG tablet Take 10 mg by mouth daily. Takes as needed    . DEXILANT 60 MG capsule TAKE 1 CAPSULE BY MOUTH EVERY DAY AS NEEDED 30 capsule 2  . levothyroxine (SYNTHROID) 88 MCG tablet Take 88 mcg by mouth daily.    . naproxen sodium (ALEVE) 220 MG tablet Take 220 mg by mouth daily as needed.     No current facility-administered medications for this visit.    PAST MEDICAL HISTORY: Past Medical History:  Diagnosis Date  . Arthritis   . Cancer [facility]) 2008   thyroid  . Facial droop   .  GERD (gastroesophageal reflux disease)   . Paresthesia   . PONV (postoperative nausea and vomiting)   . Seasonal allergies     PAST SURGICAL HISTORY: Past Surgical History:  Procedure Laterality Date  . CARPOMETACARPAL ([place]) FUSION OF THUMB  11/29/2012   Procedure: CARPOMETACARPAL ([place]) FUSION OF THUMB;  Surgeon: Cammie Sickle., MD;  Location: [facility];  Service: Orthopedics;  Laterality: Right;  Fusion Right Thumb IP Joint   . CERVICAL DISCECTOMY  10/2018  . HERNIA REPAIR  1994   Right  . KNEE ARTHROSCOPY  1990 & 1991  . SHOULDER ARTHROSCOPY Right 05/2018  . THYROIDECTOMY  2008  . TONSILLECTOMY    . WRIST ARTHROSCOPY  01/07/2012   Procedure: ARTHROSCOPY WRIST;  Surgeon: Cammie Sickle., MD;  Location: [place];  Service: Orthopedics;  Laterality: Left;  left wrist arthroscopy with debridement triangular fibrocartlidge complex repair ulna shortening and ulna nonunion repair.    FAMILY HISTORY: Family History  Problem Relation Age of  Onset  . Stomach cancer Mother   . Heart disease Father   . Lung cancer Father   . Diabetes Neg Hx   . Stroke Neg Hx     SOCIAL HISTORY: Social History   Socioeconomic History  . Marital status: Married    Spouse name: Not on file  . Number of children: 2  . Years of education: some college  . Highest education level: Not on file  Occupational History  . Occupation: Retired Engineer, structural  Tobacco Use  . Smoking status: Never Smoker  . Smokeless tobacco: Never Used  Substance and Sexual Activity  . Alcohol use: No  . Drug use: No  . Sexual activity: Yes  Other Topics Concern  . Not on file  Social History Narrative  . Not on file   Social Determinants of Health   Financial Resource Strain: Not on file  Food Insecurity: Not on file  Transportation Needs: Not on file  Physical Activity: Not on file  Stress: Not on file  Social Connections: Not on file  Intimate Partner Violence: Not on  file      Marcial Pacas, M.D. Ph.D.  [facility] [address] [place], [place] [address] Ph: [phone] Fax: [phone]  CC:  Jacelyn Pi, [address] [place],  [place] [phone]  Janith Lima, MD

## 2021-04-22 ENCOUNTER — Encounter: Payer: Self-pay | Admitting: Neurology

## 2021-04-22 ENCOUNTER — Telehealth: Payer: Self-pay | Admitting: *Deleted

## 2021-04-22 LAB — FOLATE: Folate: 7.4 ng/mL (ref >3.0)

## 2021-04-22 LAB — VITAMIN B12: Vitamin B-12: 746 pg/mL (ref 232–1245)

## 2021-04-22 LAB — VITAMIN D 25 HYDROXY (VIT D DEFICIENCY, FRACTURES): Vit D, 25-Hydroxy: 28.3 ng/mL — ABNORMAL LOW (ref 30.0–100.0)

## 2021-04-22 NOTE — Telephone Encounter (Signed)
I have spoken to the patient. He is aware of his lab results and agreeable to start the recommended vitamin D3 supplement.

## 2021-04-22 NOTE — Telephone Encounter (Signed)
-----   Message from Marcial Pacas, MD sent at 04/22/2021  2:32 PM EDT ----- Please call patient for mildly low vitamin D level 28, he would benefit over-the-counter vitamin D3 supplement 1000 units daily.

## 2021-05-28 ENCOUNTER — Encounter: Payer: 59 | Admitting: Internal Medicine

## 2021-07-10 ENCOUNTER — Other Ambulatory Visit: Payer: Self-pay | Admitting: Nurse Practitioner

## 2021-07-10 ENCOUNTER — Encounter: Payer: Self-pay | Admitting: Nurse Practitioner

## 2021-07-10 ENCOUNTER — Other Ambulatory Visit: Payer: Self-pay

## 2021-07-10 ENCOUNTER — Ambulatory Visit: Payer: 59 | Admitting: Nurse Practitioner

## 2021-07-10 VITALS — BP 118/84 | HR 88 | Temp 98.6°F | Ht 69.0 in | Wt 192.2 lb

## 2021-07-10 DIAGNOSIS — M5412 Radiculopathy, cervical region: Secondary | ICD-10-CM

## 2021-07-10 DIAGNOSIS — K219 Gastro-esophageal reflux disease without esophagitis: Secondary | ICD-10-CM | POA: Diagnosis not present

## 2021-07-10 DIAGNOSIS — N1831 Chronic kidney disease, stage 3a: Secondary | ICD-10-CM | POA: Diagnosis not present

## 2021-07-10 DIAGNOSIS — Z7689 Persons encountering health services in other specified circumstances: Secondary | ICD-10-CM | POA: Diagnosis not present

## 2021-07-10 DIAGNOSIS — E890 Postprocedural hypothyroidism: Secondary | ICD-10-CM | POA: Diagnosis not present

## 2021-07-10 MED ORDER — DEXLANSOPRAZOLE 60 MG PO CPDR: 60.0000 mg | DELAYED_RELEASE_CAPSULE | Freq: Every day | ORAL | 2 refills | Status: AC | PRN

## 2021-07-10 NOTE — Assessment & Plan Note (Deleted)
Neck Sugery in 2019; some stiffness, [ain is beeter.  - Dr. Sherley Bounds

## 2021-07-10 NOTE — Progress Notes (Signed)
Subjective:    Patient ID: Matthew Newton, male    DOB: 04-Jul-1961, 60 y.o.   MRN: [id number]  HPI: Matthew Newton is a 60 y.o. male presenting for new patient visit to establish care.  Introduced to Designer, jewellery role and practice setting.  All questions answered.  Discussed provider/patient relationship and expectations.  Chief Complaint  Patient presents with   Establish Care    No medical concerns today, needs refill on dexilant   GERD Takes Dexilant as needed prior to eating foods that trigger him.  Controls his acid reflux symptoms mostly by not eating dinner at night time.  This has also helped him lose weight. GERD control status: controlled Satisfied with current treatment? yes Heartburn frequency: rarely Medication side effects: no  Medication compliance: takes as needed Dysphagia: no Odynophagia:  no Hematemesis: no Blood in stool: no EGD: no  History of thyroid cancer status post thyroidectomy-and postsurgical hypothyroidism.  Follows yearly with endocrinologist.  History of neck pain status post cervical surgery in 2019.  Reports the pain is better, he still does have some stiffness.  Does not need to follow with neurosurgery any longer.  Reports osteoarthritis mostly in his hands.  It does not bother him too much.  CHRONIC KIDNEY DISEASE STAGE IIIa CKD status: controlled Medications renally dose: yes Previous renal evaluation: yes Pneumovax:  Not up to Date Influenza Vaccine:  Up to Date  Allergies  Allergen Reactions   Contrast Media [Iodinated Diagnostic Agents] Shortness Of Breath    Pt states it "closed his chest up" and he "couldn't breathe".   Metrizamide Shortness Of Breath    Pt states it "closed his chest up" and he "couldn't breathe".    Outpatient Encounter Medications as of 07/10/2021  Medication Sig Note   acetaminophen (TYLENOL) 500 MG tablet Take 500 mg by mouth every 6 (six) hours as needed. 07/10/2021: prn   cetirizine (ZYRTEC) 10  MG tablet Take 10 mg by mouth daily. Takes as needed    Cholecalciferol (VITAMIN D3 PO) Take 1,000 Units by mouth daily.    levothyroxine (SYNTHROID) 112 MCG tablet Take 88 mcg by mouth.    vitamin B-12 (CYANOCOBALAMIN) 1000 MCG tablet Take 1,000 mcg by mouth daily.    [DISCONTINUED] DEXILANT 60 MG capsule TAKE 1 CAPSULE BY MOUTH EVERY DAY AS NEEDED    dexlansoprazole (DEXILANT) 60 MG capsule Take 1 capsule (60 mg total) by mouth daily as needed.    [DISCONTINUED] levothyroxine (SYNTHROID) 88 MCG tablet Take 88 mcg by mouth daily.    No facility-administered encounter medications on file as of 07/10/2021.    Active Ambulatory Problems    Diagnosis Date Noted   Hypothyroidism 10/28/2017   GERD (gastroesophageal reflux disease) 10/28/2017   Seasonal allergies 10/28/2017   OA (osteoarthritis) 11/20/2019   Stage 3a chronic kidney disease ([place]) 11/26/2020   History of thyroid cancer 12/27/2017   Radiculopathy, cervical 12/27/2017   Resolved Ambulatory Problems    Diagnosis Date Noted   Influenza A 02/18/2018   Ruptured cervical disc 10/31/2018   Itching 04/21/2021   Past Medical History:  Diagnosis Date   Arthritis    Cancer ([place]) 2008   Facial droop    Paresthesia    PONV (postoperative nausea and vomiting)     Past Medical History:  Diagnosis Date   Arthritis    Cancer ([place]) 2008   thyroid   Facial droop    GERD (gastroesophageal reflux disease)    Paresthesia    PONV (  postoperative nausea and vomiting)    Seasonal allergies     Past Surgical History:  Procedure Laterality Date   CARPOMETACARPAL ([place]) FUSION OF THUMB  11/29/2012   Procedure: CARPOMETACARPAL ([place]) FUSION OF THUMB;  Surgeon: Cammie Sickle., MD;  Location: [facility];  Service: Orthopedics;  Laterality: Right;  Fusion Right Thumb IP Joint    CERVICAL DISCECTOMY  10/2018   HERNIA REPAIR  1994   Right   KNEE ARTHROSCOPY  1990 & 1991   SHOULDER ARTHROSCOPY Right 05/2018    THYROIDECTOMY  2008   TONSILLECTOMY     WRIST ARTHROSCOPY  01/07/2012   Procedure: ARTHROSCOPY WRIST;  Surgeon: Cammie Sickle., MD;  Location: [facility];  Service: Orthopedics;  Laterality: Left;  left wrist arthroscopy with debridement triangular fibrocartlidge complex repair ulna shortening and ulna nonunion repair.    Social History   Tobacco Use   Smoking status: Never   Smokeless tobacco: Never  Substance Use Topics   Alcohol use: No   Drug use: No    Family History  Problem Relation Age of Onset   Stomach cancer Mother    Heart disease Father    Lung cancer Father    Diabetes Neg Hx    Stroke Neg Hx     Review of Systems Per HPI unless specifically indicated above     Objective:    BP 118/84   Pulse 88   Temp 98.6 F (37 C)   Ht 5\' 9"  (1.753 m)   Wt 192 lb 3.2 oz (87.2 kg)   SpO2 97%   BMI 28.38 kg/m   Wt Readings from Last 3 Encounters:  07/10/21 192 lb 3.2 oz (87.2 kg)  04/21/21 193 lb 8 oz (87.8 kg)  11/26/20 198 lb (89.8 kg)    Physical Exam Vitals and nursing note reviewed.  Constitutional:      General: He is not in acute distress.    Appearance: Normal appearance. He is normal weight. He is not toxic-appearing.  HENT:     Head: Normocephalic and atraumatic.     Nose: Nose normal. No congestion.     Mouth/Throat:     Mouth: Mucous membranes are moist.     Pharynx: Oropharynx is clear. No oropharyngeal exudate or posterior oropharyngeal erythema.  Eyes:     General: No scleral icterus.    Extraocular Movements: Extraocular movements intact.  Neck:     Vascular: No carotid bruit.  Cardiovascular:     Rate and Rhythm: Normal rate and regular rhythm.     Heart sounds: Normal heart sounds. No murmur heard. Pulmonary:     Effort: Pulmonary effort is normal. No respiratory distress.     Breath sounds: Normal breath sounds. No wheezing or rhonchi.  Abdominal:     General: Abdomen is flat. Bowel sounds are normal. There is no  distension.  Musculoskeletal:        General: Normal range of motion.     Cervical back: Normal range of motion.     Right lower leg: No edema.     Left lower leg: No edema.  Skin:    General: Skin is warm and dry.     Capillary Refill: Capillary refill takes less than 2 seconds.     Coloration: Skin is not jaundiced or pale.  Neurological:     General: No focal deficit present.     Mental Status: He is alert and oriented to person, place, and time.  Motor: No weakness.     Gait: Gait normal.  Psychiatric:        Mood and Affect: Mood normal.        Behavior: Behavior normal.        Thought Content: Thought content normal.        Judgment: Judgment normal.      Assessment & Plan:  1. Encounter to establish care   2. Gastroesophageal reflux disease without esophagitis Chronic.  Takes Dexilant as needed.  Refill given today.  No red flag signs or symptoms.  Knows triggers and in general, tries to avoid.  Continue as needed medication-follow-up if symptoms worsen.  - dexlansoprazole (DEXILANT) 60 MG capsule; Take 1 capsule (60 mg total) by mouth daily as needed.  Dispense: 30 capsule; Refill: 2  3. Postoperative hypothyroidism Follows yearly with endocrinology-has been on levothyroxine on a stable dose for years.  Continue collaboration with endocrinology.  4. Stage 3a chronic kidney disease ([place]) Per patient, had evaluation with nephrologist.  Blood pressures well controlled today.  No history of diabetes.  Continue to avoid nephrotoxins like NSAIDs.  Otherwise, continue to keep a close eye on.  Due for pneumonia vaccine.  Follow-up in  4 months for physical.    Follow up plan: Return for physical .

## 2021-07-15 ENCOUNTER — Other Ambulatory Visit: Payer: Self-pay

## 2021-08-04 ENCOUNTER — Encounter: Payer: 59 | Admitting: Internal Medicine

## 2021-11-12 ENCOUNTER — Ambulatory Visit: Payer: 59 | Admitting: Internal Medicine

## 2021-11-26 ENCOUNTER — Encounter: Payer: 59 | Admitting: Internal Medicine

## 2021-12-01 ENCOUNTER — Encounter: Payer: 59 | Admitting: Nurse Practitioner

## 2023-03-10 ENCOUNTER — Other Ambulatory Visit: Payer: Self-pay
